# Patient Record
Sex: Male | Born: 1960 | Race: Black or African American | Marital: Single | Smoking: Current every day smoker
Health system: Southern US, Community
[De-identification: ages and names within clinical notes are randomized; demographics above are authoritative.]

---

## 2011-04-01 ENCOUNTER — Emergency Department (HOSPITAL_COMMUNITY)
Admission: EM | Admit: 2011-04-01 | Discharge: 2011-04-01 | Disposition: A | Discharge status: Home / Self Care | Payer: Self-pay | Attending: Emergency Medicine | Admitting: Emergency Medicine

## 2011-04-01 ENCOUNTER — Emergency Department (HOSPITAL_COMMUNITY): Payer: Self-pay

## 2011-04-01 DIAGNOSIS — W540XXA Bitten by dog, initial encounter: Secondary | ICD-10-CM | POA: Insufficient documentation

## 2011-04-01 DIAGNOSIS — F17200 Nicotine dependence, unspecified, uncomplicated: Secondary | ICD-10-CM | POA: Insufficient documentation

## 2011-04-01 DIAGNOSIS — S61409A Unspecified open wound of unspecified hand, initial encounter: Secondary | ICD-10-CM | POA: Insufficient documentation

## 2011-04-01 DIAGNOSIS — S61459A Open bite of unspecified hand, initial encounter: Secondary | ICD-10-CM

## 2011-04-01 MED ORDER — HYDROCODONE-ACETAMINOPHEN 5-325 MG PO TABS
2.0000 | ORAL_TABLET | Freq: Once | ORAL | Status: AC
  Administered 2011-04-01: 2 via ORAL
  Filled 2011-04-01: qty 2

## 2011-04-01 MED ORDER — AMOXICILLIN-POT CLAVULANATE 875-125 MG PO TABS
1.0000 | ORAL_TABLET | Freq: Once | ORAL | Status: AC
  Administered 2011-04-01: 1 via ORAL
  Filled 2011-04-01: qty 1

## 2011-04-01 MED ORDER — TETANUS-DIPHTH-ACELL PERTUSSIS 5-2.5-18.5 LF-MCG/0.5 IM SUSP
0.5000 mL | Freq: Once | INTRAMUSCULAR | Status: AC
  Administered 2011-04-01: 0.5 mL via INTRAMUSCULAR
  Filled 2011-04-01: qty 0.5

## 2011-04-01 MED ORDER — ONDANSETRON HCL 4 MG PO TABS
4.0000 mg | ORAL_TABLET | Freq: Once | ORAL | Status: AC
  Administered 2011-04-01: 4 mg via ORAL
  Filled 2011-04-01: qty 1

## 2011-04-01 MED ORDER — HYDROCODONE-ACETAMINOPHEN 5-325 MG PO TABS: ORAL_TABLET | ORAL | Status: DC

## 2011-04-01 MED ORDER — AMOXICILLIN-POT CLAVULANATE 875-125 MG PO TABS: ORAL_TABLET | ORAL | Status: DC

## 2011-04-01 NOTE — ED Notes (Signed)
Pressure dressing and bulk dressing applied.

## 2011-04-01 NOTE — ED Notes (Signed)
Pt spilled soak on floor. New mixture of NS and betadine provided and spill cleaned up.

## 2011-04-01 NOTE — ED Notes (Signed)
Pt a/ox4. resp even and unlabored. D/C instructions and Rx x2 reviewed with pt. Pt verbalized understanding. Pt ambulated to POV with steady gate. Family with pt to transport home.

## 2011-04-01 NOTE — ED Notes (Signed)
Report made to [facility].

## 2011-04-01 NOTE — ED Notes (Signed)
Pt reports was bit by a friend's dog on the R hand.  Pt says unsure if shots are up to date on the dog.  Pt says thinks his tetanus shot was greater than 10years ago.  Pt has puncture wound on top of R hand and on bottom of hand.  Bleeding controlled, swelling noted.

## 2011-04-02 NOTE — ED Provider Notes (Signed)
Medical screening examination/treatment/procedure(s) were performed by non-physician practitioner and as supervising physician I was immediately available for consultation/collaboration.   Shelda Jakes, MD 04/02/11 [phone]

## 2011-04-02 NOTE — ED Provider Notes (Signed)
History     CSN: [id number] Arrival date & time: 04/01/2011  1:41 PM   First MD Initiated Contact with Patient 04/01/11 1343      Chief Complaint  Patient presents with  . Animal Bite    (Consider location/radiation/quality/duration/timing/severity/associated sxs/prior treatment) Patient is a 50 y.o. male presenting with animal bite. The history is provided by the patient.  Animal Bite  The incident occurred just prior to arrival. The incident occurred at another residence. He came to the ER via personal transport. There is an injury to the right hand. The pain is moderate. It is unlikely that a foreign body is present. Pertinent negatives include no chest pain, no numbness, no abdominal pain, no bladder incontinence, no neck pain, no seizures, no cough and no difficulty breathing. There have been no prior injuries to these areas. He is right-handed. His tetanus status is out of date. He has been behaving normally. He has received no recent medical care.    History reviewed. No pertinent past medical history.  History reviewed. No pertinent past surgical history.  No family history on file.  History  Substance Use Topics  . Smoking status: Current Everyday Smoker  . Smokeless tobacco: Not on file  . Alcohol Use: Yes     heavily      Review of Systems  Constitutional: Negative for activity change.       All ROS Neg except as noted in HPI  HENT: Negative for nosebleeds and neck pain.   Eyes: Negative for photophobia and discharge.  Respiratory: Negative for cough, shortness of breath and wheezing.   Cardiovascular: Negative for chest pain and palpitations.  Gastrointestinal: Negative for abdominal pain and blood in stool.  Genitourinary: Negative for bladder incontinence, dysuria, frequency and hematuria.  Musculoskeletal: Negative for back pain and arthralgias.  Skin: Negative.   Neurological: Negative for dizziness, seizures, speech difficulty and numbness.    Psychiatric/Behavioral: Negative for hallucinations and confusion.    Allergies  Review of patient's allergies indicates no known allergies.  Home Medications   Current Outpatient Rx  Name Route Sig Dispense Refill  . AMOXICILLIN-POT CLAVULANATE 875-125 MG PO TABS  1 po bid with food 14 tablet 0  . HYDROCODONE-ACETAMINOPHEN 5-325 MG PO TABS  1 po q4h prn pain 20 tablet 0    BP 114/86  Pulse 84  Temp(Src) 97.6 F (36.4 C) (Oral)  Resp 17  Ht 6' (1.829 m)  Wt 180 lb (81.647 kg)  BMI 24.41 kg/m2  SpO2 100%  Physical Exam  Nursing note and vitals reviewed. Constitutional: He is oriented to person, place, and time. He appears well-developed and well-nourished.  Non-toxic appearance.  HENT:  Head: Normocephalic.  Right Ear: Tympanic membrane and external ear normal.  Left Ear: Tympanic membrane and external ear normal.  Eyes: EOM and lids are normal. Pupils are equal, round, and reactive to light.  Neck: Normal range of motion. Neck supple. Carotid bruit is not present.  Cardiovascular: Normal rate, regular rhythm, normal heart sounds, intact distal pulses and normal pulses.   Pulmonary/Chest: Breath sounds normal. No respiratory distress.  Abdominal: Soft. Bowel sounds are normal. There is no tenderness. There is no guarding.  Musculoskeletal: Normal range of motion.       Puncture wound to the mid dorsum of the right hand, with oozing of blood and hematoma present. No arterial spray. Good cap refill of the right hand. Palmar arch test wnl. FROM of all fingers. The is a shallow laceration at the  ulnar base of the right hand/wrist area. No bleeding from this site.   Lymphadenopathy:       Head (right side): No submandibular adenopathy present.       Head (left side): No submandibular adenopathy present.    He has no cervical adenopathy.  Neurological: He is alert and oriented to person, place, and time. He has normal strength. No cranial nerve deficit or sensory deficit. He  exhibits normal muscle tone.  Skin: Skin is warm and dry.  Psychiatric: He has a normal mood and affect. His speech is normal.    ED Course: Sheriff came to talk with pt. They will observe the dog. Tetanus and antibiotic given to the patient while in ED. Sterile pressure bandage applied to the patient's right hand.  Procedures (including critical care time)  Labs Reviewed - No data to display Dg Hand Complete Right  04/01/2011  *RADIOLOGY REPORT*  Clinical Data: Dog bite, lacerations to the right hand  RIGHT HAND - COMPLETE 3+ VIEW  Comparison: None.  Findings: No radiopaque foreign body.  No fracture or dislocation. No soft tissue abnormality.  The fingers are incompletely splayed on the lateral view.  IMPRESSION: No acute finding.  Original Report Authenticated By: Harrel Lemon, M.D.     1. Animal bite of hand       MDM  I have reviewed nursing notes, vital signs, and all appropriate lab and imaging results for this patient.        Kathie Dike, [place] 04/02/11 1119

## 2013-05-10 ENCOUNTER — Encounter (HOSPITAL_COMMUNITY): Payer: Self-pay | Admitting: Emergency Medicine

## 2013-05-10 ENCOUNTER — Emergency Department (HOSPITAL_COMMUNITY): Payer: Self-pay

## 2013-05-10 ENCOUNTER — Inpatient Hospital Stay (HOSPITAL_COMMUNITY)
Admission: EM | Admit: 2013-05-10 | Discharge: 2013-05-13 | DRG: 603 | Disposition: A | Discharge status: Home / Self Care | Payer: Self-pay | Attending: Otolaryngology | Admitting: Otolaryngology

## 2013-05-10 DIAGNOSIS — F17200 Nicotine dependence, unspecified, uncomplicated: Secondary | ICD-10-CM | POA: Diagnosis present

## 2013-05-10 DIAGNOSIS — L0211 Cutaneous abscess of neck: Principal | ICD-10-CM | POA: Diagnosis present

## 2013-05-10 LAB — BASIC METABOLIC PANEL
BUN: 7 mg/dL (ref 6–23)
Creatinine, Ser: 0.78 mg/dL (ref 0.50–1.35)
GFR calc Af Amer: >90 mL/min (ref >90)
GFR calc non Af Amer: >90 mL/min (ref >90)
Glucose, Bld: 127 mg/dL — ABNORMAL HIGH (ref 70–99)

## 2013-05-10 LAB — CBC WITH DIFFERENTIAL/PLATELET
Basophils Relative: 0 % (ref 0–1)
Eosinophils Absolute: 0 10*3/uL (ref 0.0–0.7)
Eosinophils Relative: 0 % (ref 0–5)
HCT: 41.1 % (ref 39.0–52.0)
Hemoglobin: 13.8 g/dL (ref 13.0–17.0)
MCH: 30.4 pg (ref 26.0–34.0)
MCHC: 33.6 g/dL (ref 30.0–36.0)
MCV: 90.5 fL (ref 78.0–100.0)
Monocytes Absolute: 1.5 10*3/uL — ABNORMAL HIGH (ref 0.1–1.0)
Monocytes Relative: 14 % — ABNORMAL HIGH (ref 3–12)

## 2013-05-10 MED ORDER — ACETAMINOPHEN 160 MG/5ML PO SOLN
ORAL | Status: AC
  Administered 2013-05-10: 650 mg
  Filled 2013-05-10: qty 20.3

## 2013-05-10 MED ORDER — VANCOMYCIN HCL IN DEXTROSE 1-5 GM/200ML-% IV SOLN
1000.0000 mg | Freq: Once | INTRAVENOUS | Status: AC
  Administered 2013-05-10: 1000 mg via INTRAVENOUS
  Filled 2013-05-10: qty 200

## 2013-05-10 MED ORDER — IOHEXOL 300 MG/ML  SOLN
100.0000 mL | Freq: Once | INTRAMUSCULAR | Status: AC | PRN
  Administered 2013-05-10: 100 mL via INTRAVENOUS

## 2013-05-10 MED ORDER — ACETAMINOPHEN 325 MG PO TABS
650.0000 mg | ORAL_TABLET | Freq: Once | ORAL | Status: DC
  Filled 2013-05-10: qty 2

## 2013-05-10 MED ORDER — SODIUM CHLORIDE 0.9 % IV SOLN
Freq: Once | INTRAVENOUS | Status: AC
  Administered 2013-05-10: 21:00:00 via INTRAVENOUS

## 2013-05-10 NOTE — ED Provider Notes (Signed)
CSN: [id number]     Arrival date & time 05/10/13  1737 History   First MD Initiated Contact with Patient 05/10/13 2038     Chief Complaint  Patient presents with  . Abscess   (Consider location/radiation/quality/duration/timing/severity/associated sxs/prior Treatment) HPI Hx per PT - L neck pain. Abscess, worsening over the last 2 days - evaluated at [facility] and sent here to see ENT Dr Pollyann Kennedy.  IV ABx were provided. PT still has some pain.  Sharp pain mod to severe with severe swelling and today fever. No trouble breathing History reviewed. No pertinent past medical history. History reviewed. No pertinent past surgical history. No family history on file. History  Substance Use Topics  . Smoking status: Current Every Day Smoker  . Smokeless tobacco: Not on file  . Alcohol Use: Yes     Comment: heavily    Review of Systems  Constitutional: Positive for fever.  HENT: Negative for sore throat and voice change.   Respiratory: Negative for shortness of breath.   Cardiovascular: Negative for chest pain.  Gastrointestinal: Negative for vomiting and abdominal pain.  Genitourinary: Negative for dysuria.  Musculoskeletal: Positive for neck pain.  Skin: Positive for rash and wound.  Neurological: Negative for headaches.  All other systems reviewed and are negative.    Allergies  Review of patient's allergies indicates no known allergies.  Home Medications   Current Outpatient Rx  Name  Route  Sig  Dispense  Refill  . acetaminophen (TYLENOL) 500 MG tablet   Oral   Take 500 mg by mouth every 6 (six) hours as needed.         Marland Kitchen Phenyleph-CPM-DM-Aspirin (ALKA-SELTZER PLUS COLD & COUGH) 7.12-14-08-325 MG TBEF   Oral   Take 1 tablet by mouth daily as needed (for cold relief).          BP 131/89  Pulse 93  Temp(Src) 100.6 F (38.1 C) (Oral)  Resp 20  Ht 6' (1.829 m)  Wt 180 lb (81.647 kg)  BMI 24.41 kg/m2  SpO2 99% Physical Exam  Constitutional: He is oriented to person,  place, and time. He appears well-developed and well-nourished.  HENT:  Head: Normocephalic and atraumatic.  Eyes: EOM are normal. Pupils are equal, round, and reactive to light.  Neck:  Large area of tenderness and fluctuance to left lateral neck with erythema and inc warmth to touch   Cardiovascular: Regular rhythm and intact distal pulses.   Pulmonary/Chest: Effort normal. No respiratory distress.  Abdominal: Soft. He exhibits no distension. There is no tenderness. There is no rebound.  Musculoskeletal: Normal range of motion. He exhibits no edema.  Neurological: He is alert and oriented to person, place, and time.  Skin: Skin is warm and dry.    ED Course  Procedures (including critical care time) Labs Review Labs Reviewed  CBC WITH DIFFERENTIAL - Abnormal; Notable for the following:    Monocytes Relative 14 (*)    Monocytes Absolute 1.5 (*)    All other components within normal limits  BASIC METABOLIC PANEL - Abnormal; Notable for the following:    Sodium 129 (*)    Chloride 90 (*)    Glucose, Bld 127 (*)    All other components within normal limits   Imaging Review Ct Soft Tissue Neck W Contrast  05/10/2013   CLINICAL DATA:  Tender enlarged lymph node on the left neck for 4 days. Fever.  EXAM: CT NECK WITH CONTRAST  TECHNIQUE: Multidetector CT imaging of the neck was performed  using the standard protocol following the bolus administration of intravenous contrast.  CONTRAST:  OMNIPAQUE IOHEXOL 300 MG/ML  SOLN  COMPARISON:  None.  FINDINGS: The large heterogeneous mass is centered in the left submandibular space. Is contiguous with the lateral margin of the left submandibular gland, which is deviated medially. This appears to have arisen from the left submandibular gland. It is predominantly of low attenuation with irregular peripheral enhancement and areas of central reticular enhancement. The adjacent soft tissues are inflamed. The mass extends superiorly to abut the left  [facility] term muscle and the inferior aspect of the superficial left parotid gland. There is only mild associated adenopathy. A left Parry jugular level 2 node is 11 mm in short axis.  The right submandibular gland is unremarkable. The soft tissue edema extends across the submental space to the right submandibular space. Mass effect mildly deviates the or all pharynx and laryngeal structures to the right. The parapharyngeal spaces are relatively maintained. The nasopharynx is unremarkable. The mass measures 6.3 cm x 5.7 cm x 7.3 cm. It abuts the inferior aspect of the mandible mostly along its angle. No bone resorption is seen to suggest osteomyelitis.  The structures of the skullbase are unremarkable. Normal globes and orbits. The airway is widely patent. Edema extends to involve the submucosal space of the oral pharynx. No mucosal space mass is seen. The thyroid gland is unremarkable.  The lung apices are clear. The aortic arch is mildly dilated to a maximum of 3.6 cm.  There are mild degenerative changes along the mid cervical spine. No osteoblastic or osteolytic lesions.  IMPRESSION: 1. There is a large mass centered on the left submandibular space. This may have arisen from the lateral margin of the left submandibular gland. Given the history, a submandibular gland abscess is suspected. However, based on imaging alone, this could be a large necrotic mass, which would include neoplastic disease. 2. There is only mild associated left neck adenopathy. 3. There is surrounding soft tissue edema with edema extending to the sub mucosal space of the oral pharynx. Mass effect causes mild deviation of the oral pharynx and larynx to the right. No airway compromise.   Electronically Signed   By: Amie Portland M.D.   On: 05/10/2013 21:34   11:53 PM Dr Pollyann Kennedy bedside, plan OR for I/D.  MDM   1. Neck abscess    CT as above IV ABx  Pain control    Sunnie Nielsen, MD 05/10/13 [phone]

## 2013-05-10 NOTE — ED Provider Notes (Signed)
CSN: [id number]     Arrival date & time 05/10/13  1737 History  This chart was scribed for Benny Lennert, MD by Clydene Laming, ED Scribe. This patient was seen in room [facility] and the patient's care was started at 8:40 PM.   Chief Complaint  Patient presents with  . Abscess    Patient is a 52 y.o. male presenting with abscess. The history is provided by the patient. No language interpreter was used.  Abscess Location:  Head/neck and face Head/neck abscess location:  L neck Facial abscess location:  Face Size:  30 cm (circumference) Abscess quality: painful and redness   Red streaking: yes   Duration:  4 days Progression:  Worsening Pain details:    Quality:  Dull   Severity:  Mild   Duration:  4 days   Timing:  Constant   Progression:  Worsening Chronicity:  New Associated symptoms: fever   Associated symptoms: no fatigue and no headaches    HPI Comments: Richard Mcfarland is a 52 y.o. male who presents to the Emergency Department complaining of a large swollen area to left side of face and neck with redness onset 4 days ago with an associated fever. Pt states swelling became worse the past 2 days. He denies any medical problems, trouble breathing, or swallowing.   History reviewed. No pertinent past medical history. History reviewed. No pertinent past surgical history. No family history on file. History  Substance Use Topics  . Smoking status: Current Every Day Smoker  . Smokeless tobacco: Not on file  . Alcohol Use: Yes     Comment: heavily    Review of Systems  Constitutional: Positive for fever. Negative for appetite change and fatigue.  HENT: Positive for facial swelling. Negative for congestion, ear discharge and sinus pressure.   Eyes: Negative for discharge.  Respiratory: Negative for cough.   Cardiovascular: Negative for chest pain.  Gastrointestinal: Negative for abdominal pain and diarrhea.  Genitourinary: Negative for frequency and hematuria.   Musculoskeletal: Negative for back pain.  Skin: Negative for rash.  Neurological: Negative for seizures and headaches.  Psychiatric/Behavioral: Negative for hallucinations.    Allergies  Review of patient's allergies indicates no known allergies.  Home Medications   Current Outpatient Rx  Name  Route  Sig  Dispense  Refill  . amoxicillin-clavulanate (AUGMENTIN) 875-125 MG per tablet      1 po bid with food   14 tablet   0   . HYDROcodone-acetaminophen (NORCO) 5-325 MG per tablet      1 po q4h prn pain   20 tablet   0    BP 139/97  Pulse 109  Temp(Src) 102.8 F (39.3 C) (Oral)  Resp 20  Ht 6' (1.829 m)  Wt 180 lb (81.647 kg)  BMI 24.41 kg/m2  SpO2 100% Physical Exam  Nursing note and vitals reviewed. Constitutional: He is oriented to person, place, and time. He appears well-developed and well-nourished.  HENT:  Head: Normocephalic and atraumatic.  Large abscess of the left side of the upper neck and face Pt has trismus (can only open mouth 1.5 cm )  Abscess 8 cm diameter  Eyes: EOM are normal.  Neck: Normal range of motion.  Cardiovascular: Normal rate, regular rhythm, normal heart sounds and intact distal pulses.   Pulmonary/Chest: Effort normal and breath sounds normal. No respiratory distress.  Abdominal: Soft. He exhibits no distension. There is no tenderness.  Musculoskeletal: Normal range of motion.  Neurological: He is alert and oriented  to person, place, and time.  Skin: Skin is warm and dry.  Psychiatric: He has a normal mood and affect. Judgment normal.    ED Course  Procedures (including critical care time) DIAGNOSTIC STUDIES: Oxygen Saturation is 100% on RA, normal by my interpretation.    COORDINATION OF CARE: 8:46 PM- Discussed treatment plan with pt at bedside. Pt verbalized understanding and agreement with plan.   Labs Review Labs Reviewed - No data to display Imaging Review No results found.  EKG Interpretation   None      Dr.  Pollyann Kennedy called and will see the pt in the er at [facility]  No diagnosis found. I personally performed the services described in this documentation, which was scribed in my presence. The recorded information has been reviewed and is accurate.     Benny Lennert, MD 05/10/13 2138

## 2013-05-10 NOTE — ED Notes (Signed)
Pt reports tender enlarged lympnode on left side of neck x 4 days.  Reports swelling became worse the past 2 days.  Pt has large swollen area to left side of face and neck, redness, noted and pt has fever.

## 2013-05-10 NOTE — H&P (Signed)
Richard Mcfarland is an 52 y.o. male.   Chief Complaint: Neck swelling HPI: 4 day history of increasing left neck swelling and pain. No prior history.  History reviewed. No pertinent past medical history.  History reviewed. No pertinent past surgical history.  No family history on file. Social History:  reports that he has been smoking.  He does not have any smokeless tobacco history on file. He reports that he drinks alcohol. He reports that he does not use illicit drugs. He hasn't had a drink in 3 days.  Allergies: No Known Allergies   (Not in a hospital admission)  Results for orders placed during the hospital encounter of 05/10/13 (from the past 48 hour(s))  CBC WITH DIFFERENTIAL     Status: Abnormal   Collection Time    05/10/13  8:48 PM      Result Value Range   WBC 10.2  4.0 - 10.5 K/uL   RBC 4.54  4.22 - 5.81 MIL/uL   Hemoglobin 13.8  13.0 - 17.0 g/dL   HCT 16.1  09.6 - 04.5 %   MCV 90.5  78.0 - 100.0 fL   MCH 30.4  26.0 - 34.0 pg   MCHC 33.6  30.0 - 36.0 g/dL   RDW 40.9  81.1 - 91.4 %   Platelets 286  150 - 400 K/uL   Neutrophils Relative % 72  43 - 77 %   Neutro Abs 7.4  1.7 - 7.7 K/uL   Lymphocytes Relative 13  12 - 46 %   Lymphs Abs 1.3  0.7 - 4.0 K/uL   Monocytes Relative 14 (*) 3 - 12 %   Monocytes Absolute 1.5 (*) 0.1 - 1.0 K/uL   Eosinophils Relative 0  0 - 5 %   Eosinophils Absolute 0.0  0.0 - 0.7 K/uL   Basophils Relative 0  0 - 1 %   Basophils Absolute 0.0  0.0 - 0.1 K/uL  BASIC METABOLIC PANEL     Status: Abnormal   Collection Time    05/10/13  8:48 PM      Result Value Range   Sodium 129 (*) 135 - 145 mEq/L   Potassium 3.5  3.5 - 5.1 mEq/L   Chloride 90 (*) 96 - 112 mEq/L   CO2 28  19 - 32 mEq/L   Glucose, Bld 127 (*) 70 - 99 mg/dL   BUN 7  6 - 23 mg/dL   Creatinine, Ser 7.82  0.50 - 1.35 mg/dL   Calcium 9.3  8.4 - 95.6 mg/dL   GFR calc non Af Amer >90  >90 mL/min   GFR calc Af Amer >90  >90 mL/min   Comment: (NOTE)     The eGFR has been  calculated using the CKD EPI equation.     This calculation has not been validated in all clinical situations.     eGFR's persistently <90 mL/min signify possible Chronic Kidney     Disease.   Ct Soft Tissue Neck W Contrast  05/10/2013   CLINICAL DATA:  Tender enlarged lymph node on the left neck for 4 days. Fever.  EXAM: CT NECK WITH CONTRAST  TECHNIQUE: Multidetector CT imaging of the neck was performed using the standard protocol following the bolus administration of intravenous contrast.  CONTRAST:  OMNIPAQUE IOHEXOL 300 MG/ML  SOLN  COMPARISON:  None.  FINDINGS: The large heterogeneous mass is centered in the left submandibular space. Is contiguous with the lateral margin of the left submandibular gland, which is deviated  medially. This appears to have arisen from the left submandibular gland. It is predominantly of low attenuation with irregular peripheral enhancement and areas of central reticular enhancement. The adjacent soft tissues are inflamed. The mass extends superiorly to abut the left [facility] term muscle and the inferior aspect of the superficial left parotid gland. There is only mild associated adenopathy. A left Parry jugular level 2 node is 11 mm in short axis.  The right submandibular gland is unremarkable. The soft tissue edema extends across the submental space to the right submandibular space. Mass effect mildly deviates the or all pharynx and laryngeal structures to the right. The parapharyngeal spaces are relatively maintained. The nasopharynx is unremarkable. The mass measures 6.3 cm x 5.7 cm x 7.3 cm. It abuts the inferior aspect of the mandible mostly along its angle. No bone resorption is seen to suggest osteomyelitis.  The structures of the skullbase are unremarkable. Normal globes and orbits. The airway is widely patent. Edema extends to involve the submucosal space of the oral pharynx. No mucosal space mass is seen. The thyroid gland is unremarkable.  The lung apices are  clear. The aortic arch is mildly dilated to a maximum of 3.6 cm.  There are mild degenerative changes along the mid cervical spine. No osteoblastic or osteolytic lesions.  IMPRESSION: 1. There is a large mass centered on the left submandibular space. This may have arisen from the lateral margin of the left submandibular gland. Given the history, a submandibular gland abscess is suspected. However, based on imaging alone, this could be a large necrotic mass, which would include neoplastic disease. 2. There is only mild associated left neck adenopathy. 3. There is surrounding soft tissue edema with edema extending to the sub mucosal space of the oral pharynx. Mass effect causes mild deviation of the oral pharynx and larynx to the right. No airway compromise.   Electronically Signed   By: Amie Portland M.D.   On: 05/10/2013 21:34    ROS: otherwise negative  Blood pressure 131/89, pulse 93, temperature 100.6 F (38.1 C), temperature source Oral, resp. rate 20, height 6' (1.829 m), weight 180 lb (81.647 kg), SpO2 99.00%.  PHYSICAL EXAM: Overall appearance:  Healthy appearing, in no distress Head:  Normocephalic, atraumatic. Ears: External ears look normal. Nose: External nose is healthy in appearance. Internal nasal exam free of any lesions or obstruction. Oral Cavity/pharynx:  There are no mucosal lesions or masses identified. There is mild trismus. The floor of mouth and pharynx/base of tongue are free of any swelling. Dentition is in poor repair. Neuro:  No identifiable neurologic deficits. Neck: Very large fluctuant swelling of the left submandibular area. It is mildly tender to touch.  Studies Reviewed: CT of neck with contrast reviewed.    Assessment/Plan Neck abscess, submandibular region, possibly dental origin. Recommend incision and drainage under general anesthesia. Airway is not expected to be an issue. We discussed the very unlikely need for tracheostomy. We will need intravenous  antibiotics and wound care for at least several days in the hospital. He will ultimately need dental care. He has a history of alcohol use, has not had a drink in 3 days. He has had the "shakes" in the past. All questions were answered.  Haruna Rohlfs 05/10/2013, 11:56 PM

## 2013-05-11 ENCOUNTER — Emergency Department (HOSPITAL_COMMUNITY): Payer: Self-pay | Admitting: Anesthesiology

## 2013-05-11 ENCOUNTER — Encounter (HOSPITAL_COMMUNITY)
Admission: EM | Disposition: A | Discharge status: Home / Self Care | Payer: Self-pay | Source: Home / Self Care | Attending: Otolaryngology

## 2013-05-11 ENCOUNTER — Encounter (HOSPITAL_COMMUNITY): Payer: Self-pay | Admitting: Anesthesiology

## 2013-05-11 DIAGNOSIS — L0211 Cutaneous abscess of neck: Secondary | ICD-10-CM | POA: Diagnosis present

## 2013-05-11 HISTORY — PX: INCISION AND DRAINAGE ABSCESS: SHX5864

## 2013-05-11 SURGERY — INCISION AND DRAINAGE, ABSCESS
Anesthesia: General | Site: Neck | Laterality: Left

## 2013-05-11 MED ORDER — DEXTROSE-NACL 5-0.9 % IV SOLN
INTRAVENOUS | Status: DC
  Administered 2013-05-11 (×2): via INTRAVENOUS

## 2013-05-11 MED ORDER — ONDANSETRON HCL 4 MG/2ML IJ SOLN
INTRAMUSCULAR | Status: DC | PRN
  Administered 2013-05-11: 4 mg via INTRAVENOUS

## 2013-05-11 MED ORDER — LIDOCAINE HCL (CARDIAC) 20 MG/ML IV SOLN
INTRAVENOUS | Status: DC | PRN
  Administered 2013-05-11: 75 mg via INTRAVENOUS

## 2013-05-11 MED ORDER — FENTANYL CITRATE 0.05 MG/ML IJ SOLN
INTRAMUSCULAR | Status: DC | PRN
  Administered 2013-05-11: 100 ug via INTRAVENOUS

## 2013-05-11 MED ORDER — HYDROMORPHONE HCL PF 1 MG/ML IJ SOLN: 0.2500 mg | INTRAMUSCULAR | Status: DC | PRN

## 2013-05-11 MED ORDER — IBUPROFEN 100 MG/5ML PO SUSP
400.0000 mg | Freq: Four times a day (QID) | ORAL | Status: DC | PRN
  Filled 2013-05-11: qty 20

## 2013-05-11 MED ORDER — SODIUM CHLORIDE 0.9 % IV SOLN: 3.0000 g | Freq: Four times a day (QID) | INTRAVENOUS | Status: DC

## 2013-05-11 MED ORDER — PROMETHAZINE HCL 25 MG PO TABS: 25.0000 mg | ORAL_TABLET | Freq: Four times a day (QID) | ORAL | Status: DC | PRN

## 2013-05-11 MED ORDER — PROPOFOL 10 MG/ML IV BOLUS
INTRAVENOUS | Status: DC | PRN
  Administered 2013-05-11: 200 mg via INTRAVENOUS

## 2013-05-11 MED ORDER — MIDAZOLAM HCL 5 MG/5ML IJ SOLN
INTRAMUSCULAR | Status: DC | PRN
  Administered 2013-05-11: 2 mg via INTRAVENOUS

## 2013-05-11 MED ORDER — SODIUM CHLORIDE 0.9 % IV SOLN
3.0000 g | Freq: Four times a day (QID) | INTRAVENOUS | Status: DC
  Administered 2013-05-11 – 2013-05-13 (×11): 3 g via INTRAVENOUS
  Filled 2013-05-11 (×15): qty 3

## 2013-05-11 MED ORDER — LACTATED RINGERS IV SOLN
INTRAVENOUS | Status: DC | PRN
  Administered 2013-05-11: via INTRAVENOUS

## 2013-05-11 MED ORDER — INFLUENZA VAC SPLIT QUAD 0.5 ML IM SUSP
0.5000 mL | INTRAMUSCULAR | Status: DC
  Filled 2013-05-11: qty 0.5

## 2013-05-11 MED ORDER — SUCCINYLCHOLINE CHLORIDE 20 MG/ML IJ SOLN
INTRAMUSCULAR | Status: DC | PRN
  Administered 2013-05-11: 120 mg via INTRAVENOUS

## 2013-05-11 MED ORDER — HYDROCODONE-ACETAMINOPHEN 5-325 MG PO TABS: 1.0000 | ORAL_TABLET | ORAL | Status: DC | PRN

## 2013-05-11 MED ORDER — 0.9 % SODIUM CHLORIDE (POUR BTL) OPTIME
TOPICAL | Status: DC | PRN
  Administered 2013-05-11: 1000 mL

## 2013-05-11 MED ORDER — PROMETHAZINE HCL 25 MG RE SUPP: 25.0000 mg | Freq: Four times a day (QID) | RECTAL | Status: DC | PRN

## 2013-05-11 SURGICAL SUPPLY — 25 items
BLADE SURG 15 STRL LF DISP TIS (BLADE) ×1 IMPLANT
BLADE SURG 15 STRL SS (BLADE) ×1
CANISTER SUCTION 2500CC (MISCELLANEOUS) IMPLANT
COVER SURGICAL LIGHT HANDLE (MISCELLANEOUS) ×2 IMPLANT
DRAIN PENROSE 1/2X12 LTX STRL (WOUND CARE) ×2 IMPLANT
DRAIN PENROSE 1/4X12 LTX STRL (WOUND CARE) IMPLANT
ELECT COATED BLADE 2.86 ST (ELECTRODE) ×2 IMPLANT
ELECT REM PT RETURN 9FT ADLT (ELECTROSURGICAL) ×2
ELECTRODE REM PT RTRN 9FT ADLT (ELECTROSURGICAL) ×1 IMPLANT
GLOVE ECLIPSE 7.5 STRL STRAW (GLOVE) ×2 IMPLANT
GOWN STRL NON-REIN LRG LVL3 (GOWN DISPOSABLE) ×4 IMPLANT
KIT BASIN OR (CUSTOM PROCEDURE TRAY) ×2 IMPLANT
KIT ROOM TURNOVER OR (KITS) ×2 IMPLANT
NS IRRIG 1000ML POUR BTL (IV SOLUTION) ×2 IMPLANT
PAD ARMBOARD 7.5X6 YLW CONV (MISCELLANEOUS) ×4 IMPLANT
PIN SAFETY STERILE (MISCELLANEOUS) ×2 IMPLANT
SPONGE GAUZE 4X4 12PLY (GAUZE/BANDAGES/DRESSINGS) ×2 IMPLANT
SUT ETHILON 3 0 FSL (SUTURE) ×2 IMPLANT
SUT ETHILON 4 0 PS 2 18 (SUTURE) ×2 IMPLANT
SWAB COLLECTION DEVICE MRSA (MISCELLANEOUS) IMPLANT
TOWEL OR 17X24 6PK STRL BLUE (TOWEL DISPOSABLE) ×2 IMPLANT
TOWEL OR 17X26 10 PK STRL BLUE (TOWEL DISPOSABLE) ×2 IMPLANT
TRAY ENT MC OR (CUSTOM PROCEDURE TRAY) ×2 IMPLANT
TUBE ANAEROBIC SPECIMEN COL (MISCELLANEOUS) IMPLANT
TUBE GAUZE SZ 8 (GAUZE/BANDAGES/DRESSINGS) ×2 IMPLANT

## 2013-05-11 NOTE — Transfer of Care (Signed)
Immediate Anesthesia Transfer of Care Note  Patient: Richard Mcfarland  Procedure(s) Performed: Procedure(s): INCISION AND DRAINAGE ABSCESS (Left)  Patient Location: PACU  Anesthesia Type:General  Level of Consciousness: awake, alert  and oriented  Airway & Oxygen Therapy: Patient Spontanous Breathing and Patient connected to nasal cannula oxygen  Post-op Assessment: Report given to PACU RN and Post -op Vital signs reviewed and stable  Post vital signs: Reviewed and stable  Complications: No apparent anesthesia complications

## 2013-05-11 NOTE — Op Note (Signed)
OPERATIVE REPORT  DATE OF SURGERY: 05/11/2013  PATIENT:  Richard Mcfarland,  52 y.o. male  PRE-OPERATIVE DIAGNOSIS:  left neck abscess  POST-OPERATIVE DIAGNOSIS:  left neck abscess  PROCEDURE:  Procedure(s): INCISION AND DRAINAGE ABSCESS  SURGEON:  Susy Frizzle, MD  ASSISTANTS: none  ANESTHESIA:   General   EBL:  25 ml  DRAINS: 2 half-inch Penrose drains  LOCAL MEDICATIONS USED:  None  SPECIMEN:  Specimen was sent for culture and sensitivity testing.  COUNTS:  Correct  PROCEDURE DETAILS: The patient was taken to the operating room and placed on the operating table in the supine position. Following induction of general endotracheal anesthesia, the left neck was prepped and draped in a standard fashion. Electrocautery was used to incise the skin in a transverse fashion approximately 2 fingerbreadths below the angle of the mandible. This was done in the center of the abscess cavity. Blunt dissection was then used to open into the abscess and a large amount of purulent mucoid secretions were obtained. Samples were sent for culture and sensitivity testing. A Yankauer suction was used to evacuate all of the infectious material. Saline was then irrigated into the wound and suctioned again. 2 half-inch Penrose drains were placed one anterosuperiorly and the other superiorly. These were secured in place with a nylon suture and a sterile safety pin. Dressing was applied. Plastic gauze was placed. Patient was awakened extubated and transferred to recovery in stable condition.    PATIENT DISPOSITION:  To PACU, stable

## 2013-05-11 NOTE — Progress Notes (Signed)
Patient transferred to 6N01 from PACU following drainage of an abcess on the left side of his neck.  Patient oriented to unit and equipment with call bell in reach.  He denies pain at this time.  Vital signs stable and no acute distress.  Will continue to monitor.

## 2013-05-11 NOTE — Anesthesia Procedure Notes (Signed)
Procedure Name: Intubation Date/Time: 05/11/2013 12:30 AM Performed by: Mera Gunkel S Pre-anesthesia Checklist: Patient identified, Timeout performed, Emergency Drugs available, Suction available and Patient being monitored Patient Re-evaluated:Patient Re-evaluated prior to inductionOxygen Delivery Method: Circle system utilized Preoxygenation: Pre-oxygenation with 100% oxygen Intubation Type: IV induction, Rapid sequence and Cricoid Pressure applied Ventilation: Mask ventilation without difficulty Laryngoscope size: planned glidescope. Tube type: Oral Tube size: 8.0 mm Number of attempts: 1 Airway Equipment and Method: Stylet Placement Confirmation: ETT inserted through vocal cords under direct vision,  positive ETCO2 and breath sounds checked- equal and bilateral Secured at: 23 cm Tube secured with: Tape

## 2013-05-11 NOTE — Anesthesia Preprocedure Evaluation (Addendum)
Anesthesia Evaluation  Patient identified by MRN, date of birth, ID band Patient awake  General Assessment Comment:Case discussed with Dr. Pollyann Kennedy. Airway patent and glide video scope will be utilized. CE  Reviewed: Allergy & Precautions, H&P , NPO status , Patient's Chart, lab work & pertinent test results  History of Anesthesia Complications Negative for: history of anesthetic complications  Airway Mallampati: II    Mouth opening: Limited Mouth Opening Comment: Submandibular abscess - [facility]  (+) Teeth Intact, Poor Dentition, Chipped, Missing and Dental Advisory Given   Pulmonary neg pulmonary ROS, Current Smoker,          Cardiovascular negative cardio ROS      Neuro/Psych    GI/Hepatic negative GI ROS, Neg liver ROS,   Endo/Other  negative endocrine ROS  Renal/GU negative Renal ROS     Musculoskeletal negative musculoskeletal ROS (+)   Abdominal   Peds  Hematology negative hematology ROS (+)   Anesthesia Other Findings   Reproductive/Obstetrics                       Anesthesia Physical Anesthesia Plan  ASA: II and emergent  Anesthesia Plan: General   Post-op Pain Management:    Induction: Intravenous  Airway Management Planned: Oral ETT  Additional Equipment:   Intra-op Plan:   Post-operative Plan: Extubation in OR  Informed Consent: I have reviewed the patients History and Physical, chart, labs and discussed the procedure including the risks, benefits and alternatives for the proposed anesthesia with the patient or authorized representative who has indicated his/her understanding and acceptance.   Dental advisory given  Plan Discussed with: CRNA, Anesthesiologist and Surgeon  Anesthesia Plan Comments:         Anesthesia Quick Evaluation

## 2013-05-11 NOTE — Anesthesia Postprocedure Evaluation (Signed)
  Anesthesia Post-op Note  Patient: Richard Mcfarland  Procedure(s) Performed: Procedure(s): INCISION AND DRAINAGE ABSCESS (Left)  Patient Location: PACU  Anesthesia Type:General  Level of Consciousness: awake  Airway and Oxygen Therapy: Patient Spontanous Breathing  Post-op Pain: mild  Post-op Assessment: Post-op Vital signs reviewed  Post-op Vital Signs: Reviewed  Complications: No apparent anesthesia complications

## 2013-05-11 NOTE — Preoperative (Signed)
Beta Blockers   Reason not to administer Beta Blockers:Not Applicable 

## 2013-05-11 NOTE — Progress Notes (Signed)
Subjective: Feeling a little better, minimal pain.  Objective: Vital signs in last 24 hours: Temp:  [99.1 F (37.3 C)-102.8 F (39.3 C)] 100.8 F (38.2 C) (12/28 0631) Pulse Rate:  [84-109] 87 (12/28 0631) Resp:  [14-21] 19 (12/28 0631) BP: (119-139)/(68-97) 122/85 mmHg (12/28 0631) SpO2:  [94 %-100 %] 96 % (12/28 0631) Weight:  [174 lb 3.2 oz (79.017 kg)-180 lb (81.647 kg)] 174 lb 3.2 oz (79.017 kg) (12/28 0152) Weight change:  Last BM Date: 05/03/13  Intake/Output from previous day: 12/27 0701 - 12/28 0700 In: 1026.3 [P.O.:120; I.V.:806.3; IV Piggyback:100] Out: 450 [Urine:300] Intake/Output this shift:    PHYSICAL EXAM: Neck much improved. Drainage persists - dressing changed.   Lab Results:  Recent Labs  05/10/13 2048  WBC 10.2  HGB 13.8  HCT 41.1  PLT 286   BMET  Recent Labs  05/10/13 2048  NA 129*  K 3.5  CL 90*  CO2 28  GLUCOSE 127*  BUN 7  CREATININE 0.78  CALCIUM 9.3    Studies/Results: Ct Soft Tissue Neck W Contrast  05/10/2013   CLINICAL DATA:  Tender enlarged lymph node on the left neck for 4 days. Fever.  EXAM: CT NECK WITH CONTRAST  TECHNIQUE: Multidetector CT imaging of the neck was performed using the standard protocol following the bolus administration of intravenous contrast.  CONTRAST:  OMNIPAQUE IOHEXOL 300 MG/ML  SOLN  COMPARISON:  None.  FINDINGS: The large heterogeneous mass is centered in the left submandibular space. Is contiguous with the lateral margin of the left submandibular gland, which is deviated medially. This appears to have arisen from the left submandibular gland. It is predominantly of low attenuation with irregular peripheral enhancement and areas of central reticular enhancement. The adjacent soft tissues are inflamed. The mass extends superiorly to abut the left [facility] term muscle and the inferior aspect of the superficial left parotid gland. There is only mild associated adenopathy. A left Parry jugular level 2 node  is 11 mm in short axis.  The right submandibular gland is unremarkable. The soft tissue edema extends across the submental space to the right submandibular space. Mass effect mildly deviates the or all pharynx and laryngeal structures to the right. The parapharyngeal spaces are relatively maintained. The nasopharynx is unremarkable. The mass measures 6.3 cm x 5.7 cm x 7.3 cm. It abuts the inferior aspect of the mandible mostly along its angle. No bone resorption is seen to suggest osteomyelitis.  The structures of the skullbase are unremarkable. Normal globes and orbits. The airway is widely patent. Edema extends to involve the submucosal space of the oral pharynx. No mucosal space mass is seen. The thyroid gland is unremarkable.  The lung apices are clear. The aortic arch is mildly dilated to a maximum of 3.6 cm.  There are mild degenerative changes along the mid cervical spine. No osteoblastic or osteolytic lesions.  IMPRESSION: 1. There is a large mass centered on the left submandibular space. This may have arisen from the lateral margin of the left submandibular gland. Given the history, a submandibular gland abscess is suspected. However, based on imaging alone, this could be a large necrotic mass, which would include neoplastic disease. 2. There is only mild associated left neck adenopathy. 3. There is surrounding soft tissue edema with edema extending to the sub mucosal space of the oral pharynx. Mass effect causes mild deviation of the oral pharynx and larynx to the right. No airway compromise.   Electronically Signed   By: Onalee Hua  Ormond M.D.   On: 05/10/2013 21:34    Medications: I have reviewed the patient's current medications.  Assessment/Plan: Post op 1 I&D left neck abscess. Continue IV Unasyn. Await culture results.    LOS: 1 day   Virginie Josten 05/11/2013, 9:19 AM

## 2013-05-12 NOTE — Progress Notes (Signed)
ENT Progress Note: POD # 2 s/p Procedure(s): INCISION AND DRAINAGE ABSCESS   Subjective: No complaints, min pain  Objective: Vital signs in last 24 hours: Temp:  [98.3 F (36.8 C)-99.5 F (37.5 C)] 98.4 F (36.9 C) (12/29 1357) Pulse Rate:  [62-82] 64 (12/29 1357) Resp:  [16-18] 18 (12/29 1357) BP: (125-136)/(80-101) 131/101 mmHg (12/29 1357) SpO2:  [97 %-99 %] 99 % (12/29 1357) Weight change:  Last BM Date: 05/10/13  Intake/Output from previous day: 12/28 0701 - 12/29 0700 In: 1025 [I.V.:825; IV Piggyback:200] Out: 1600 [Urine:1600] Intake/Output this shift: Total I/O In: 360 [P.O.:360] Out: -   Labs:  Recent Labs  05/10/13 2048  WBC 10.2  HGB 13.8  HCT 41.1  PLT 286    Recent Labs  05/10/13 2048  NA 129*  K 3.5  CL 90*  CO2 28  GLUCOSE 127*  BUN 7  CALCIUM 9.3    Studies/Results: Ct Soft Tissue Neck W Contrast  05/10/2013   CLINICAL DATA:  Tender enlarged lymph node on the left neck for 4 days. Fever.  EXAM: CT NECK WITH CONTRAST  TECHNIQUE: Multidetector CT imaging of the neck was performed using the standard protocol following the bolus administration of intravenous contrast.  CONTRAST:  OMNIPAQUE IOHEXOL 300 MG/ML  SOLN  COMPARISON:  None.  FINDINGS: The large heterogeneous mass is centered in the left submandibular space. Is contiguous with the lateral margin of the left submandibular gland, which is deviated medially. This appears to have arisen from the left submandibular gland. It is predominantly of low attenuation with irregular peripheral enhancement and areas of central reticular enhancement. The adjacent soft tissues are inflamed. The mass extends superiorly to abut the left [facility] term muscle and the inferior aspect of the superficial left parotid gland. There is only mild associated adenopathy. A left Parry jugular level 2 node is 11 mm in short axis.  The right submandibular gland is unremarkable. The soft tissue edema extends across  the submental space to the right submandibular space. Mass effect mildly deviates the or all pharynx and laryngeal structures to the right. The parapharyngeal spaces are relatively maintained. The nasopharynx is unremarkable. The mass measures 6.3 cm x 5.7 cm x 7.3 cm. It abuts the inferior aspect of the mandible mostly along its angle. No bone resorption is seen to suggest osteomyelitis.  The structures of the skullbase are unremarkable. Normal globes and orbits. The airway is widely patent. Edema extends to involve the submucosal space of the oral pharynx. No mucosal space mass is seen. The thyroid gland is unremarkable.  The lung apices are clear. The aortic arch is mildly dilated to a maximum of 3.6 cm.  There are mild degenerative changes along the mid cervical spine. No osteoblastic or osteolytic lesions.  IMPRESSION: 1. There is a large mass centered on the left submandibular space. This may have arisen from the lateral margin of the left submandibular gland. Given the history, a submandibular gland abscess is suspected. However, based on imaging alone, this could be a large necrotic mass, which would include neoplastic disease. 2. There is only mild associated left neck adenopathy. 3. There is surrounding soft tissue edema with edema extending to the sub mucosal space of the oral pharynx. Mass effect causes mild deviation of the oral pharynx and larynx to the right. No airway compromise.   Electronically Signed   By: Amie Portland M.D.   On: 05/10/2013 21:34     PHYSICAL EXAM: Drains in-place, cont mod  d/c Cont swelling and mild erythema   Assessment/Plan: Pt stable C/S from surgery pending Cont IV abx Consider d/c 12/31 if cont clinical improvement    Richard Mcfarland 05/12/2013, 5:56 PM

## 2013-05-13 ENCOUNTER — Encounter (HOSPITAL_COMMUNITY): Payer: Self-pay | Admitting: Otolaryngology

## 2013-05-13 MED ORDER — AMOXICILLIN-POT CLAVULANATE 500-125 MG PO TABS: 1.0000 | ORAL_TABLET | Freq: Two times a day (BID) | ORAL | Status: DC

## 2013-05-13 NOTE — Progress Notes (Signed)
   ENT Progress Note: POD #3  s/p Procedure(s): INCISION AND DRAINAGE ABSCESS   Subjective: No pain, improved swelling  Objective: Vital signs in last 24 hours: Temp:  [98.1 F (36.7 C)-98.4 F (36.9 C)] 98.4 F (36.9 C) (12/30 1333) Pulse Rate:  [56-103] 103 (12/30 1333) Resp:  [16-18] 18 (12/30 1333) BP: (124-138)/(84-96) 132/96 mmHg (12/30 1333) SpO2:  [99 %] 99 % (12/30 1333) Weight change:  Last BM Date: 05/12/13  Intake/Output from previous day: 12/29 0701 - 12/30 0700 In: 360 [P.O.:360] Out: 875 [Urine:875] Intake/Output this shift:    Labs:  Recent Labs  05/10/13 2048  WBC 10.2  HGB 13.8  HCT 41.1  PLT 286    Recent Labs  05/10/13 2048  NA 129*  K 3.5  CL 90*  CO2 28  GLUCOSE 127*  BUN 7  CALCIUM 9.3    Studies/Results: No results found.   PHYSICAL EXAM: Drain out, min d/c Improving swelling   Assessment/Plan: Pt stable and improving C/S shows coag neg staph, sens. Pending Clinical improvement, d/c on augmentin    Lakiya Cottam 05/13/2013, 6:25 PM

## 2013-05-13 NOTE — Discharge Summary (Signed)
Physician Discharge Summary  Patient ID: Richard Mcfarland MRN: [id number] DOB/AGE: Oct 16, 1960 52 y.o.  Admit date: 05/10/2013 Discharge date: 05/13/2013  Admission Diagnoses:  Active Problems:   Neck abscess   Discharge Diagnoses:  Same  Surgeries: Procedure(s): INCISION AND DRAINAGE ABSCESS on 05/10/2013 - 05/11/2013   Consultants: None  Discharged Condition: Improved  Hospital Course: Richard Mcfarland is an 52 y.o. male who was admitted 05/10/2013 with a diagnosis of Left neck abscess and went to the operating room on 05/10/2013 - 05/11/2013 and underwent the above named procedures.   Pt stable with gradual decrease in d/c and swelling.  Recent vital signs:  Filed Vitals:   05/13/13 1333  BP: 132/96  Pulse: 103  Temp: 98.4 F (36.9 C)  Resp: 18    Recent laboratory studies:  Results for orders placed during the hospital encounter of 05/10/13  CULTURE, ROUTINE-ABSCESS      Result Value Range   Specimen Description ABSCESS LEFT NECK     Special Requests NONE     Gram Stain       Value: MODERATE WBC PRESENT, PREDOMINANTLY PMN     NO SQUAMOUS EPITHELIAL CELLS SEEN     FEW GRAM POSITIVE RODS     Performed at [facility]   Culture       Value: FEW STAPHYLOCOCCUS SPECIES (COAGULASE NEGATIVE)     Performed at [facility]   Report Status PENDING    ANAEROBIC CULTURE      Result Value Range   Specimen Description ABSCESS LEFT NECK     Special Requests NONE     Gram Stain       Value: MODERATE WBC PRESENT, PREDOMINANTLY PMN     NO SQUAMOUS EPITHELIAL CELLS SEEN     FEW GRAM POSITIVE RODS     Performed at [facility]   Culture       Value: NO ANAEROBES ISOLATED; CULTURE IN PROGRESS FOR 5 DAYS     Performed at [facility]   Report Status PENDING    CBC WITH DIFFERENTIAL      Result Value Range   WBC 10.2  4.0 - 10.5 K/uL   RBC 4.54  4.22 - 5.81 MIL/uL   Hemoglobin 13.8  13.0 - 17.0 g/dL   HCT 57.8  46.9 - 62.9 %   MCV  90.5  78.0 - 100.0 fL   MCH 30.4  26.0 - 34.0 pg   MCHC 33.6  30.0 - 36.0 g/dL   RDW 52.8  41.3 - 24.4 %   Platelets 286  150 - 400 K/uL   Neutrophils Relative % 72  43 - 77 %   Neutro Abs 7.4  1.7 - 7.7 K/uL   Lymphocytes Relative 13  12 - 46 %   Lymphs Abs 1.3  0.7 - 4.0 K/uL   Monocytes Relative 14 (*) 3 - 12 %   Monocytes Absolute 1.5 (*) 0.1 - 1.0 K/uL   Eosinophils Relative 0  0 - 5 %   Eosinophils Absolute 0.0  0.0 - 0.7 K/uL   Basophils Relative 0  0 - 1 %   Basophils Absolute 0.0  0.0 - 0.1 K/uL  BASIC METABOLIC PANEL      Result Value Range   Sodium 129 (*) 135 - 145 mEq/L   Potassium 3.5  3.5 - 5.1 mEq/L   Chloride 90 (*) 96 - 112 mEq/L   CO2 28  19 - 32 mEq/L   Glucose, Bld 127 (*)  70 - 99 mg/dL   BUN 7  6 - 23 mg/dL   Creatinine, Ser 0.27  0.50 - 1.35 mg/dL   Calcium 9.3  8.4 - 25.3 mg/dL   GFR calc non Af Amer >90  >90 mL/min   GFR calc Af Amer >90  >90 mL/min    Discharge Medications:     Medication List    STOP taking these medications       acetaminophen 500 MG tablet  Commonly known as:  TYLENOL     ALKA-SELTZER PLUS COLD & COUGH 7.12-14-08-325 MG Tbef  Generic drug:  Phenyleph-CPM-DM-Aspirin      TAKE these medications       amoxicillin-clavulanate 500-125 MG per tablet  Commonly known as:  AUGMENTIN  Take 1 tablet (500 mg total) by mouth 2 (two) times daily.        Diagnostic Studies: Ct Soft Tissue Neck W Contrast  05/10/2013   CLINICAL DATA:  Tender enlarged lymph node on the left neck for 4 days. Fever.  EXAM: CT NECK WITH CONTRAST  TECHNIQUE: Multidetector CT imaging of the neck was performed using the standard protocol following the bolus administration of intravenous contrast.  CONTRAST:  OMNIPAQUE IOHEXOL 300 MG/ML  SOLN  COMPARISON:  None.  FINDINGS: The large heterogeneous mass is centered in the left submandibular space. Is contiguous with the lateral margin of the left submandibular gland, which is deviated medially. This appears  to have arisen from the left submandibular gland. It is predominantly of low attenuation with irregular peripheral enhancement and areas of central reticular enhancement. The adjacent soft tissues are inflamed. The mass extends superiorly to abut the left [facility] term muscle and the inferior aspect of the superficial left parotid gland. There is only mild associated adenopathy. A left Parry jugular level 2 node is 11 mm in short axis.  The right submandibular gland is unremarkable. The soft tissue edema extends across the submental space to the right submandibular space. Mass effect mildly deviates the or all pharynx and laryngeal structures to the right. The parapharyngeal spaces are relatively maintained. The nasopharynx is unremarkable. The mass measures 6.3 cm x 5.7 cm x 7.3 cm. It abuts the inferior aspect of the mandible mostly along its angle. No bone resorption is seen to suggest osteomyelitis.  The structures of the skullbase are unremarkable. Normal globes and orbits. The airway is widely patent. Edema extends to involve the submucosal space of the oral pharynx. No mucosal space mass is seen. The thyroid gland is unremarkable.  The lung apices are clear. The aortic arch is mildly dilated to a maximum of 3.6 cm.  There are mild degenerative changes along the mid cervical spine. No osteoblastic or osteolytic lesions.  IMPRESSION: 1. There is a large mass centered on the left submandibular space. This may have arisen from the lateral margin of the left submandibular gland. Given the history, a submandibular gland abscess is suspected. However, based on imaging alone, this could be a large necrotic mass, which would include neoplastic disease. 2. There is only mild associated left neck adenopathy. 3. There is surrounding soft tissue edema with edema extending to the sub mucosal space of the oral pharynx. Mass effect causes mild deviation of the oral pharynx and larynx to the right. No airway compromise.    Electronically Signed   By: Amie Portland M.D.   On: 05/10/2013 21:34    Disposition: 01-Home or Self Care      Discharge Orders   Future  Orders Complete By Expires   Diet - low sodium heart healthy  As directed    Discharge instructions  As directed    Comments:     1. Limited activity 2. Liquid and soft diet, advance as tolerated 3. May bathe and shower, keep incision dry for 3 days postop 4. Wound care - 1/2 str H2O2 and bacitracin ointment twice daily 5. Elevate Head of Bed   Increase activity slowly  As directed       Follow-up Information   Follow up with Serena Colonel, MD. Schedule an appointment as soon as possible for a visit in 10 days.   Specialty:  Otolaryngology   Contact information:   [address] [place] [address] [phone]        Signed: Osborn Coho 05/13/2013, 6:30 PM

## 2013-05-15 LAB — CULTURE, ROUTINE-ABSCESS

## 2013-05-16 LAB — ANAEROBIC CULTURE

## 2014-05-24 ENCOUNTER — Emergency Department (HOSPITAL_COMMUNITY)
Admission: EM | Admit: 2014-05-24 | Discharge: 2014-05-24 | Disposition: A | Discharge status: Home / Self Care | Payer: Self-pay | Attending: Emergency Medicine | Admitting: Emergency Medicine

## 2014-05-24 ENCOUNTER — Encounter (HOSPITAL_COMMUNITY): Payer: Self-pay | Admitting: Emergency Medicine

## 2014-05-24 DIAGNOSIS — Z792 Long term (current) use of antibiotics: Secondary | ICD-10-CM | POA: Insufficient documentation

## 2014-05-24 DIAGNOSIS — L02811 Cutaneous abscess of head [any part, except face]: Secondary | ICD-10-CM | POA: Insufficient documentation

## 2014-05-24 DIAGNOSIS — R21 Rash and other nonspecific skin eruption: Secondary | ICD-10-CM | POA: Insufficient documentation

## 2014-05-24 DIAGNOSIS — L0291 Cutaneous abscess, unspecified: Secondary | ICD-10-CM

## 2014-05-24 LAB — BASIC METABOLIC PANEL
Anion gap: 8 (ref 5–15)
BUN: 8 mg/dL (ref 6–23)
CO2: 27 mmol/L (ref 19–32)
Calcium: 9.2 mg/dL (ref 8.4–10.5)
Chloride: 98 mEq/L (ref 96–112)
Creatinine, Ser: 0.64 mg/dL (ref 0.50–1.35)
GFR calc Af Amer: >90 mL/min (ref >90)
GFR calc non Af Amer: >90 mL/min (ref >90)
GLUCOSE: 102 mg/dL — AB (ref 70–99)
Potassium: 4.2 mmol/L (ref 3.5–5.1)
Sodium: 133 mmol/L — ABNORMAL LOW (ref 135–145)

## 2014-05-24 LAB — CBC WITH DIFFERENTIAL/PLATELET
BASOS PCT: 1 % (ref 0–1)
Basophils Absolute: 0.1 10*3/uL (ref 0.0–0.1)
Eosinophils Absolute: 0.2 10*3/uL (ref 0.0–0.7)
Eosinophils Relative: 2 % (ref 0–5)
HEMATOCRIT: 43 % (ref 39.0–52.0)
Hemoglobin: 14.4 g/dL (ref 13.0–17.0)
LYMPHS PCT: 15 % (ref 12–46)
Lymphs Abs: 1.6 10*3/uL (ref 0.7–4.0)
MCH: 30.1 pg (ref 26.0–34.0)
MCHC: 33.5 g/dL (ref 30.0–36.0)
MCV: 89.8 fL (ref 78.0–100.0)
Monocytes Absolute: 1.4 10*3/uL — ABNORMAL HIGH (ref 0.1–1.0)
Monocytes Relative: 13 % — ABNORMAL HIGH (ref 3–12)
NEUTROS ABS: 7.7 10*3/uL (ref 1.7–7.7)
NEUTROS PCT: 71 % (ref 43–77)
Platelets: 245 10*3/uL (ref 150–400)
RBC: 4.79 MIL/uL (ref 4.22–5.81)
RDW: 12.8 % (ref 11.5–15.5)
WBC: 10.9 10*3/uL — AB (ref 4.0–10.5)

## 2014-05-24 MED ORDER — SULFAMETHOXAZOLE-TRIMETHOPRIM 800-160 MG PO TABS: 1.0000 | ORAL_TABLET | Freq: Two times a day (BID) | ORAL | Status: DC

## 2014-05-24 MED ORDER — LIDOCAINE HCL (PF) 1 % IJ SOLN
INTRAMUSCULAR | Status: AC
  Administered 2014-05-24: 15:00:00
  Filled 2014-05-24: qty 5

## 2014-05-24 MED ORDER — VANCOMYCIN HCL IN DEXTROSE 1-5 GM/200ML-% IV SOLN
1000.0000 mg | Freq: Once | INTRAVENOUS | Status: AC
  Administered 2014-05-24: 1000 mg via INTRAVENOUS
  Filled 2014-05-24: qty 200

## 2014-05-24 NOTE — ED Notes (Signed)
Patient c/o question bug bites to arms, legs, abd , and back. Per patient appeared three days ago. Per patient itching. Reports serosanguineous drainage. No drainage noted at this time. Denies any fevers.

## 2014-05-24 NOTE — Discharge Instructions (Signed)
Follow up in two days for recheck °

## 2014-05-24 NOTE — ED Provider Notes (Signed)
CSN: [id number]     Arrival date & time 05/24/14  1218 History   First MD Initiated Contact with Patient 05/24/14 1406     Chief Complaint  Patient presents with  . Insect Bite     (Consider location/radiation/quality/duration/timing/severity/associated sxs/prior Treatment) Patient is a 54 y.o. male presenting with rash. The history is provided by the patient (pt complains of a rash to arms and legs and swelling to back of head).  Rash Location:  Shoulder/arm (pt has swelling to back of head) Shoulder/arm rash location:  L arm and R arm Quality: not blistering   Severity:  Mild Onset quality:  Gradual Timing:  Constant Progression:  Worsening Associated symptoms: no abdominal pain, no diarrhea, no fatigue and no headaches     History reviewed. No pertinent past medical history. Past Surgical History  Procedure Laterality Date  . Incision and drainage abscess Left 05/11/2013    Procedure: INCISION AND DRAINAGE ABSCESS;  Surgeon: Serena ColonelJefry Rosen, MD;  [facility];  Service: ENT;  Laterality: Left;   Family History  Problem Relation Age of Onset  . Diabetes Other    History  Substance Use Topics  . Smoking status: Current Every Day Smoker -- 0.05 packs/day for 30 years    Types: Cigarettes  . Smokeless tobacco: Never Used  . Alcohol Use: Yes     Comment: heavily    Review of Systems  Constitutional: Negative for appetite change and fatigue.  HENT: Negative for congestion, ear discharge and sinus pressure.   Eyes: Negative for discharge.  Respiratory: Negative for cough.   Cardiovascular: Negative for chest pain.  Gastrointestinal: Negative for abdominal pain and diarrhea.  Genitourinary: Negative for frequency and hematuria.  Musculoskeletal: Negative for back pain.  Skin: Positive for rash.  Neurological: Negative for seizures and headaches.  Psychiatric/Behavioral: Negative for hallucinations.      Allergies  Review of patient's allergies indicates no known  allergies.  Home Medications   Prior to Admission medications   Medication Sig Start Date End Date Taking? Authorizing Provider  amoxicillin-clavulanate (AUGMENTIN) 500-125 MG per tablet Take 1 tablet (500 mg total) by mouth 2 (two) times daily. 05/13/13   Osborn Cohoavid Shoemaker, MD  sulfamethoxazole-trimethoprim (SEPTRA DS) 800-160 MG per tablet Take 1 tablet by mouth 2 (two) times daily. 05/24/14   Benny LennertJoseph L Danylah Holden, MD   BP 143/98 mmHg  Pulse 84  Temp(Src) 99.5 F (37.5 C) (Oral)  Resp 18  Ht 6' (1.829 m)  Wt 175 lb (79.379 kg)  BMI 23.73 kg/m2  SpO2 100% Physical Exam  Constitutional: He is oriented to person, place, and time. He appears well-developed.  HENT:  Head: Normocephalic.  Pt has a 3cm by 3cm abscess to occiptial head  Eyes: Conjunctivae and EOM are normal. No scleral icterus.  Neck: Neck supple. No thyromegaly present.  Cardiovascular: Normal rate and regular rhythm.  Exam reveals no gallop and no friction rub.   No murmur heard. Pulmonary/Chest: No stridor. He has no wheezes. He has no rales. He exhibits no tenderness.  Abdominal: He exhibits no distension. There is no tenderness. There is no rebound.  Musculoskeletal: Normal range of motion. He exhibits no edema.  Lymphadenopathy:    He has no cervical adenopathy.  Neurological: He is oriented to person, place, and time. He exhibits normal muscle tone. Coordination normal.  Skin: Rash noted. There is erythema.  Mac pap rash to arms and legs  Psychiatric: He has a normal mood and affect. His behavior is normal.  ED Course  INCISION AND DRAINAGE Date/Time: 05/24/2014 4:13 PM Performed by: Torence Palmeri L Authorized by: Bethann Berkshire L Comments: 3cm by 3 cm abscess to back of head,   Lido no epi.  #11 blade used to make incission.   Pus removed.  cx done,   Packing done   (including critical care time) Labs Review Labs Reviewed  CBC WITH DIFFERENTIAL - Abnormal; Notable for the following:    WBC 10.9 (*)     Monocytes Relative 13 (*)    Monocytes Absolute 1.4 (*)    All other components within normal limits  BASIC METABOLIC PANEL - Abnormal; Notable for the following:    Sodium 133 (*)    Glucose, Bld 102 (*)    All other components within normal limits  WOUND CULTURE    Imaging Review No results found.   EKG Interpretation None      MDM   Final diagnoses:  Abscess    Abscess,  Probable mrsa,   tx with i and d and bactrim    Benny Lennert, MD 05/24/14 1615

## 2014-05-26 ENCOUNTER — Encounter (HOSPITAL_COMMUNITY): Payer: Self-pay | Admitting: Cardiology

## 2014-05-26 ENCOUNTER — Emergency Department (HOSPITAL_COMMUNITY)
Admission: EM | Admit: 2014-05-26 | Discharge: 2014-05-26 | Disposition: A | Discharge status: Home / Self Care | Payer: Self-pay | Attending: Emergency Medicine | Admitting: Emergency Medicine

## 2014-05-26 DIAGNOSIS — L02811 Cutaneous abscess of head [any part, except face]: Secondary | ICD-10-CM | POA: Insufficient documentation

## 2014-05-26 DIAGNOSIS — Z720 Tobacco use: Secondary | ICD-10-CM | POA: Insufficient documentation

## 2014-05-26 DIAGNOSIS — L0291 Cutaneous abscess, unspecified: Secondary | ICD-10-CM

## 2014-05-26 DIAGNOSIS — Z792 Long term (current) use of antibiotics: Secondary | ICD-10-CM | POA: Insufficient documentation

## 2014-05-26 NOTE — ED Notes (Addendum)
Here for wound recheck.  Had I and D of abscess on back of head.  Also red raised area to left side of face ,  That he wanted rechecked.

## 2014-05-26 NOTE — ED Provider Notes (Signed)
CSN: [id number]     Arrival date & time 05/26/14  1041 History   First MD Initiated Contact with Patient 05/26/14 1051     Chief Complaint  Patient presents with  . Wound Check     (Consider location/radiation/quality/duration/timing/severity/associated sxs/prior Treatment) HPI Comments: Pt comes in for wound recheck of I&D on 1/10. Pt states that overall the area feels a lot better. Is taking antibiotics. No fever.   The history is provided by the patient. No language interpreter was used.    History reviewed. No pertinent past medical history. Past Surgical History  Procedure Laterality Date  . Incision and drainage abscess Left 05/11/2013    Procedure: INCISION AND DRAINAGE ABSCESS;  Surgeon: Serena ColonelJefry Rosen, MD;  [facility];  Service: ENT;  Laterality: Left;   Family History  Problem Relation Age of Onset  . Diabetes Other    History  Substance Use Topics  . Smoking status: Current Every Day Smoker -- 0.05 packs/day for 30 years    Types: Cigarettes  . Smokeless tobacco: Never Used  . Alcohol Use: Yes     Comment: heavily    Review of Systems  Constitutional: Negative.   Respiratory: Negative.   Cardiovascular: Negative.       Allergies  Review of patient's allergies indicates no known allergies.  Home Medications   Prior to Admission medications   Medication Sig Start Date End Date Taking? Authorizing Provider  amoxicillin-clavulanate (AUGMENTIN) 500-125 MG per tablet Take 1 tablet (500 mg total) by mouth 2 (two) times daily. 05/13/13   Osborn Cohoavid Shoemaker, MD  sulfamethoxazole-trimethoprim (SEPTRA DS) 800-160 MG per tablet Take 1 tablet by mouth 2 (two) times daily. 05/24/14   Benny LennertJoseph L Zammit, MD   BP 153/106 mmHg  Pulse 91  Temp(Src) 99 F (37.2 C) (Oral)  Resp 16  Ht 6' (1.829 m)  Wt 175 lb (79.379 kg)  BMI 23.73 kg/m2  SpO2 100% Physical Exam  Constitutional: He is oriented to person, place, and time. He appears well-developed and well-nourished.   Cardiovascular: Normal rate and regular rhythm.   Pulmonary/Chest: Effort normal and breath sounds normal.  Neurological: He is alert and oriented to person, place, and time.  Skin:  Mild redness noted around wound. No drainage noted. Packing removed  Nursing note and vitals reviewed.   ED Course  Procedures (including critical care time) Labs Review Labs Reviewed - No data to display  Imaging Review No results found.   EKG Interpretation None      MDM   Final diagnoses:  Abscess    Packing removed. Discussed return precautions    Teressa LowerVrinda Jimie Kuwahara, NP 05/26/14 1329  Samuel JesterKathleen McManus, DO 05/28/14 1346

## 2014-05-26 NOTE — Discharge Instructions (Signed)
Follow up or return here as needed  For worsening or continued symptoms Abscess An abscess is an infected area that contains a collection of pus and debris.It can occur in almost any part of the body. An abscess is also known as a furuncle or boil. CAUSES  An abscess occurs when tissue gets infected. This can occur from blockage of oil or sweat glands, infection of hair follicles, or a minor injury to the skin. As the body tries to fight the infection, pus collects in the area and creates pressure under the skin. This pressure causes pain. People with weakened immune systems have difficulty fighting infections and get certain abscesses more often.  SYMPTOMS Usually an abscess develops on the skin and becomes a painful mass that is red, warm, and tender. If the abscess forms under the skin, you may feel a moveable soft area under the skin. Some abscesses break open (rupture) on their own, but most will continue to get worse without care. The infection can spread deeper into the body and eventually into the bloodstream, causing you to feel ill.  DIAGNOSIS  Your caregiver will take your medical history and perform a physical exam. A sample of fluid may also be taken from the abscess to determine what is causing your infection. TREATMENT  Your caregiver may prescribe antibiotic medicines to fight the infection. However, taking antibiotics alone usually does not cure an abscess. Your caregiver may need to make a small cut (incision) in the abscess to drain the pus. In some cases, gauze is packed into the abscess to reduce pain and to continue draining the area. HOME CARE INSTRUCTIONS   Only take over-the-counter or prescription medicines for pain, discomfort, or fever as directed by your caregiver.  If you were prescribed antibiotics, take them as directed. Finish them even if you start to feel better.  If gauze is used, follow your caregiver's directions for changing the gauze.  To avoid spreading  the infection:  Keep your draining abscess covered with a bandage.  Wash your hands well.  Do not share personal care items, towels, or whirlpools with others.  Avoid skin contact with others.  Keep your skin and clothes clean around the abscess.  Keep all follow-up appointments as directed by your caregiver. SEEK MEDICAL CARE IF:   You have increased pain, swelling, redness, fluid drainage, or bleeding.  You have muscle aches, chills, or a general ill feeling.  You have a fever. MAKE SURE YOU:   Understand these instructions.  Will watch your condition.  Will get help right away if you are not doing well or get worse. Document Released: 02/08/2005 Document Revised: 10/31/2011 Document Reviewed: 07/14/2011 [facility] Patient Information 2015 [place]. This information is not intended to replace advice given to you by your health care provider. Make sure you discuss any questions you have with your health care provider.

## 2014-05-27 ENCOUNTER — Telehealth (HOSPITAL_BASED_OUTPATIENT_CLINIC_OR_DEPARTMENT_OTHER): Payer: Self-pay | Admitting: Emergency Medicine

## 2014-05-27 LAB — WOUND CULTURE

## 2014-05-27 NOTE — Telephone Encounter (Signed)
Post ED Visit - Positive Culture Follow-up  Culture report reviewed by antimicrobial stewardship pharmacist: []  Wes Dulaney, Pharm.D., BCPS []  Celedonio MiyamotoJeremy Frens, Pharm.D., BCPS []  Georgina PillionElizabeth Martin, Pharm.D., BCPS []  [place]h Pham, [address]Ph[address]D., BCPS, AAHIVP []  Estella HuskMichelle Turner, Pharm.D., BCPS, AAHIVP [x]  Elder CyphersLorie Poole, [address]Ph[address]D., BCPS  Positive MRSA culture Treated with sulfamethoxazole-trimethoprim bid x 14 days, organism sensitive to the same and no further patient follow-up is required at this time.  Berle MullMiller, Tayra Dawe 05/27/2014, 3:41 PM

## 2014-05-28 ENCOUNTER — Telehealth (HOSPITAL_BASED_OUTPATIENT_CLINIC_OR_DEPARTMENT_OTHER): Payer: Self-pay | Admitting: Emergency Medicine

## 2014-05-28 NOTE — Telephone Encounter (Signed)
Post ED Visit - Positive Culture Follow-up  Culture report reviewed by antimicrobial stewardship pharmacist: []  Wes Dulaney, Pharm.D., BCPS []  Celedonio MiyamotoJeremy Frens, Pharm.D., BCPS []  Georgina PillionElizabeth Martin, Pharm.D., BCPS []  [place] JebelMinh Pham, [address]Pharm[address]D., BCPS, AAHIVP []  Estella HuskMichelle Turner, Pharm.D., BCPS, AAHIVP [x]  Elder CyphersLorie Poole, [address]Ph[address]D., BCPS  Positive wound abcess  culture Treated with MRSA , organism sensitive to the same and no further patient follow-up is required at this time.  Berle MullMiller, Orlandis Sanden 05/28/2014, 3:29 PM

## 2014-06-02 ENCOUNTER — Emergency Department (HOSPITAL_COMMUNITY)
Admission: EM | Admit: 2014-06-02 | Discharge: 2014-06-02 | Disposition: A | Discharge status: Home / Self Care | Payer: Self-pay | Attending: Family Medicine | Admitting: Family Medicine

## 2014-06-02 ENCOUNTER — Encounter (HOSPITAL_COMMUNITY): Payer: Self-pay | Admitting: Physical Medicine and Rehabilitation

## 2014-06-02 DIAGNOSIS — Z792 Long term (current) use of antibiotics: Secondary | ICD-10-CM | POA: Insufficient documentation

## 2014-06-02 DIAGNOSIS — B86 Scabies: Secondary | ICD-10-CM | POA: Insufficient documentation

## 2014-06-02 DIAGNOSIS — R000 Tachycardia, unspecified: Secondary | ICD-10-CM | POA: Insufficient documentation

## 2014-06-02 DIAGNOSIS — Z720 Tobacco use: Secondary | ICD-10-CM | POA: Insufficient documentation

## 2014-06-02 MED ORDER — HYDROXYZINE HCL 25 MG PO TABS: 25.0000 mg | ORAL_TABLET | Freq: Three times a day (TID) | ORAL | Status: DC | PRN

## 2014-06-02 MED ORDER — PERMETHRIN 5 % EX CREA: TOPICAL_CREAM | CUTANEOUS | Status: DC

## 2014-06-02 NOTE — ED Notes (Signed)
[place] this past christ[place] and has noticed bites on his arms and lower abdomen.  Pt also reports having a bite on his posterior head drained a few weeks ago.

## 2014-06-02 NOTE — Discharge Instructions (Signed)

## 2014-06-02 NOTE — ED Notes (Signed)
Pt made aware to return if symptoms worsen or if any life threatening symptoms occur.   

## 2014-06-02 NOTE — ED Provider Notes (Signed)
CSN: [id number]     Arrival date & time 06/02/14  1459 History   First MD Initiated Contact with Patient 06/02/14 1532     Chief Complaint  Patient presents with  . Rash     (Consider location/radiation/quality/duration/timing/severity/associated sxs/prior Treatment) HPI Comments: Patient reports he developed a pruritic rash after moving down to [place] to live with friend at the end of Dec. 2015. States rash is very pruritic and began on arms and torso and seems to be spreading. Has not tried any treatment for this. [facility]. Underwent I&D and is currently taking TMP/SMX for abscess associated scalp cellulitis.  Reports himself to be otherwise healthy. PCP: none Denies previous episodes of similar rash. No mucous membrane lesions No fever, chills, blisters.  The history is provided by the patient.    History reviewed. No pertinent past medical history. Past Surgical History  Procedure Laterality Date  . Incision and drainage abscess Left 05/11/2013    Procedure: INCISION AND DRAINAGE ABSCESS;  Surgeon: Serena ColonelJefry Rosen, MD;  [facility];  Service: ENT;  Laterality: Left;   Family History  Problem Relation Age of Onset  . Diabetes Other    History  Substance Use Topics  . Smoking status: Current Every Day Smoker -- 0.05 packs/day for 30 years    Types: Cigarettes  . Smokeless tobacco: Never Used  . Alcohol Use: Yes     Comment: heavily    Review of Systems  All other systems reviewed and are negative.     Allergies  Review of patient's allergies indicates no known allergies.  Home Medications   Prior to Admission medications   Medication Sig Start Date End Date Taking? Authorizing Provider  amoxicillin-clavulanate (AUGMENTIN) 500-125 MG per tablet Take 1 tablet (500 mg total) by mouth 2 (two) times daily. 05/13/13   Osborn Cohoavid Shoemaker, MD  permethrin (ELIMITE) 5 % cream Apply to affected area once, leave 8 hours then  shower. Repeat same treatment x 1 in 7 days 06/02/14   Ria ClockJennifer Lee H Destaney Sarkis, PA  sulfamethoxazole-trimethoprim (SEPTRA DS) 800-160 MG per tablet Take 1 tablet by mouth 2 (two) times daily. 05/24/14   Benny LennertJoseph L Zammit, MD   BP 134/99 mmHg  Pulse 111  Temp(Src) 98.7 F (37.1 C) (Oral)  Resp 18  SpO2 99% Physical Exam  Constitutional: He is oriented to person, place, and time. He appears well-developed and well-nourished. No distress.  HENT:  Head: Normocephalic and atraumatic.  Mouth/Throat: Oropharynx is clear and moist.  Eyes: Conjunctivae are normal. No scleral icterus.  Cardiovascular: Regular rhythm and normal heart sounds.   +mild tachycardia  Pulmonary/Chest: Effort normal.  Musculoskeletal: Normal range of motion.  Neurological: He is alert and oriented to person, place, and time.  Skin: Skin is warm and dry. Rash noted. No erythema.  Discrete erythematous maculopapular rash on bilateral upper extremities with few scattered urticarial lesions. Many of the lesions have superficial excoriations from scratching.   Psychiatric: He has a normal mood and affect. His behavior is normal.  Nursing note and vitals reviewed.   ED Course  Procedures (including critical care time) Labs Review Labs Reviewed - No data to display  Imaging Review No results found.   EKG Interpretation None      MDM   Final diagnoses:  Scabies  Elimite and atarax as directed with PCP follow up if no improvement. Advised to provide information to other members of household.  Ria Clock, [place] 06/02/14 [phone]

## 2014-06-02 NOTE — ED Notes (Signed)
Pt presents to department for evaluation of rash and itching all over body. Ongoing for several weeks. Respirations unlabored. Pt is alert and oriented x4. NAD.

## 2015-02-19 IMAGING — CT CT NECK W/ CM
3 of 4 series · 13 of 33 positions shown, 16 images · IV contrast (Omnipaque 300)
Comparison: None.

CLINICAL DATA: Tender enlarged lymph node on the left neck for 4
days. Fever.

EXAM:
CT NECK WITH CONTRAST
TECHNIQUE: Multidetector CT imaging of the neck was performed using the
standard protocol following the bolus administration of intravenous
contrast.
CONTRAST:  100mL OMNIPAQUE IOHEXOL 300 MG/ML  SOLN

[Series 2: soft tissue neck 2.0 b31s · axial · 0.52mm/px · z∈[+1050,+1214]mm · 5 of 124 slices shown, 7 images]
[im 21/124  soft-tissue]
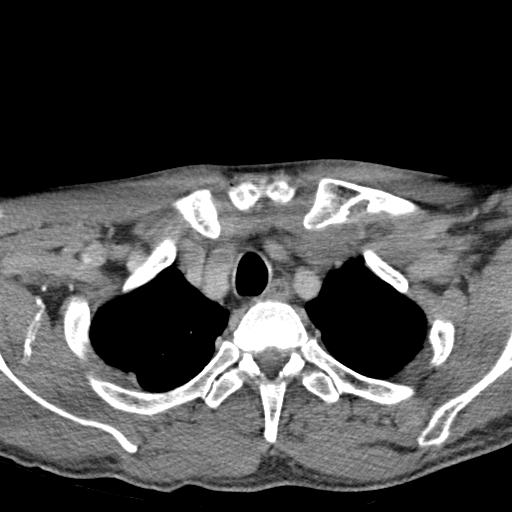
[im 21/124  bone]
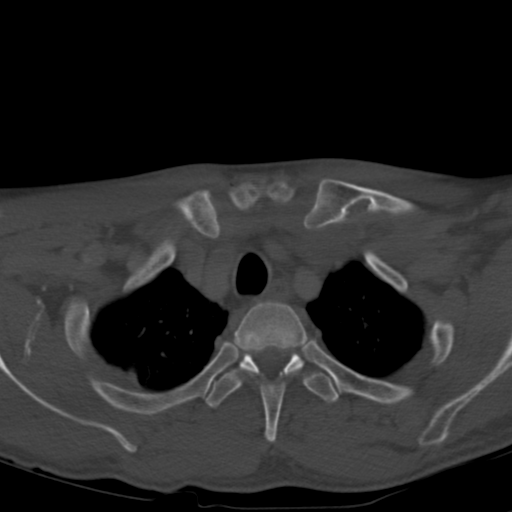
[im 42/124  bone]
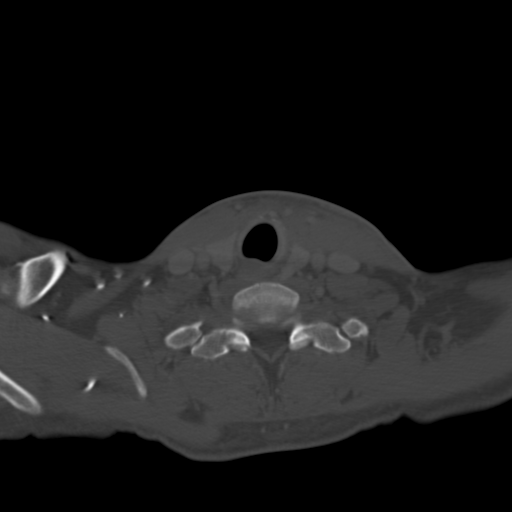
[im 62/124  bone]
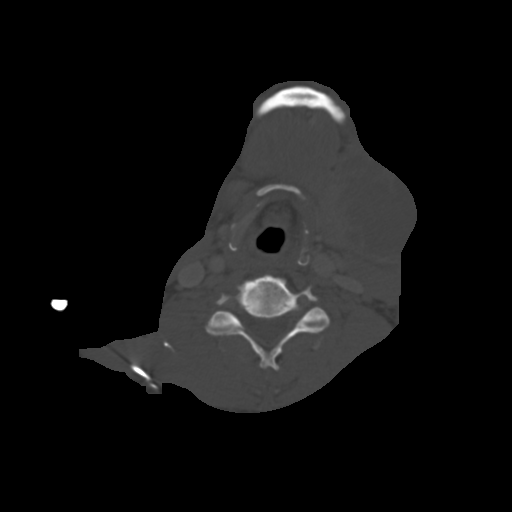
[im 83/124  bone]
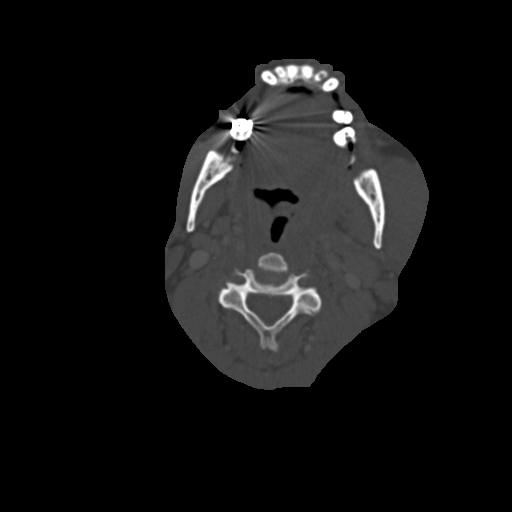
[im 103/124  soft-tissue]
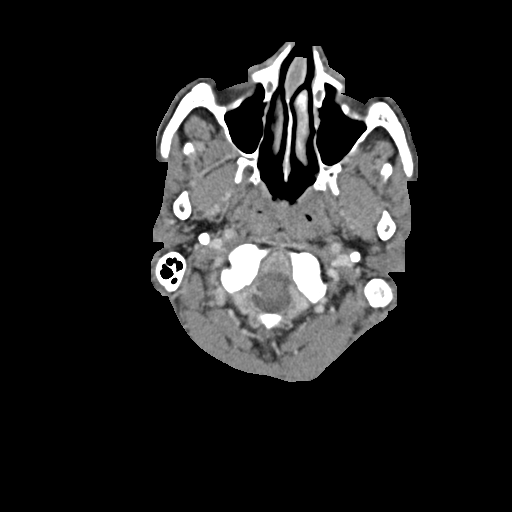
[im 103/124  bone]
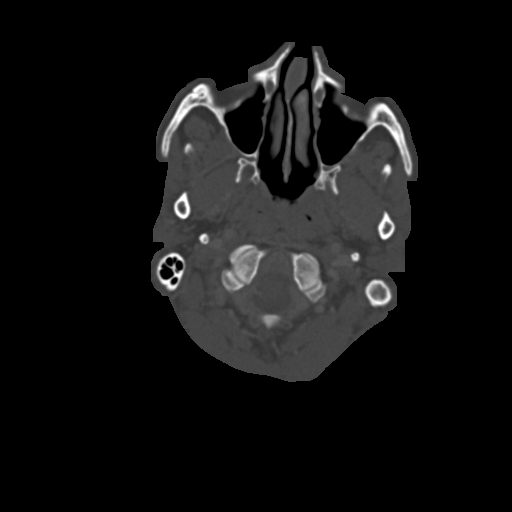

[Series 4: neck 2.0 soft tissue sag · sagittal · 0.46mm/px · 5 of 100 slices shown, 6 images]
[im 34/100  bone]
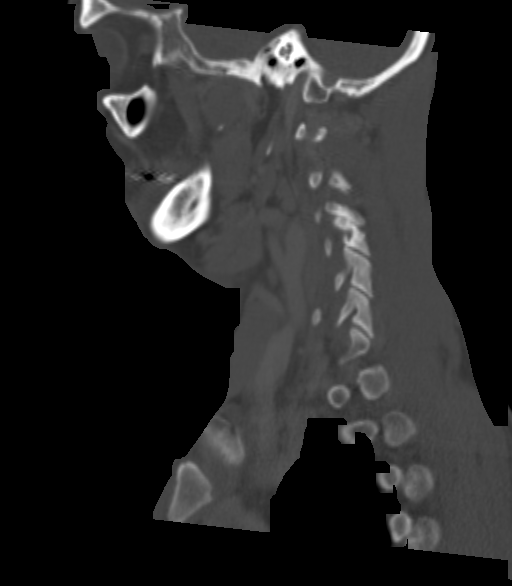
[im 42/100  bone]
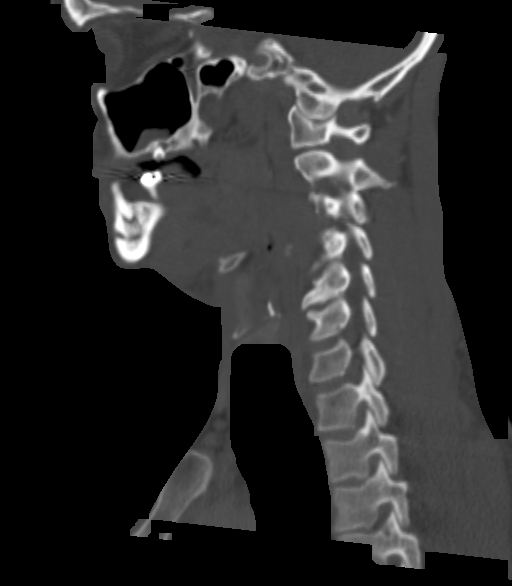
[im 50/100  soft-tissue]
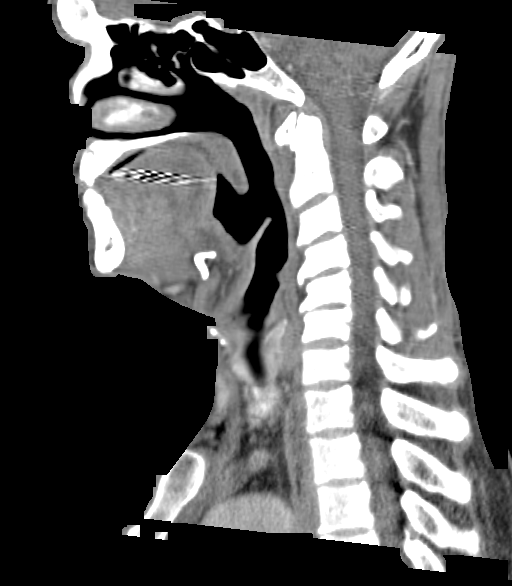
[im 50/100  bone]
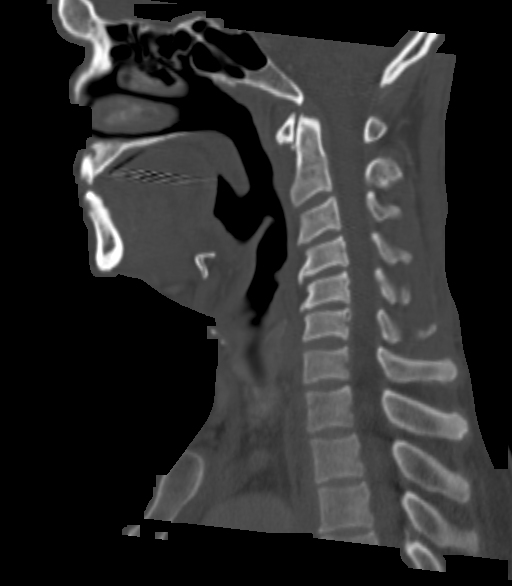
[im 58/100  bone]
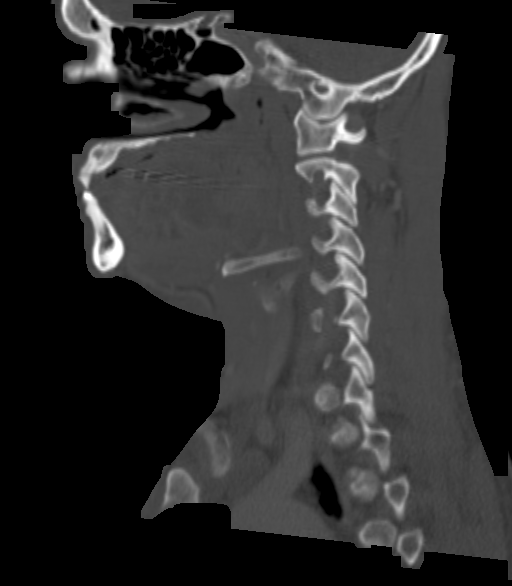
[im 67/100  bone]
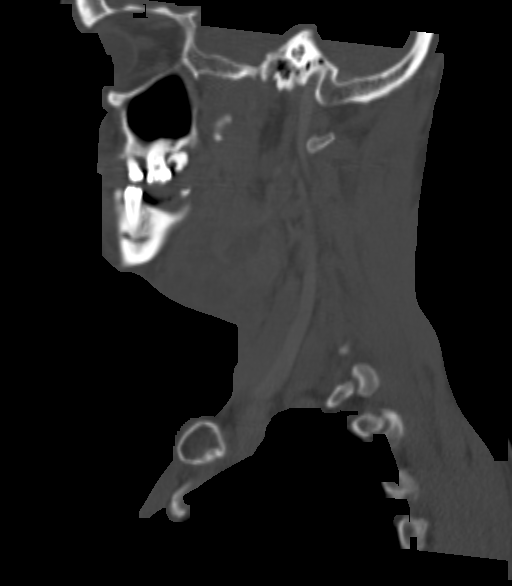

[Series 5: neck 2.0 soft tissue coro · coronal · 0.48mm/px · 3 of 135 slices shown]
[im 27/135  bone]
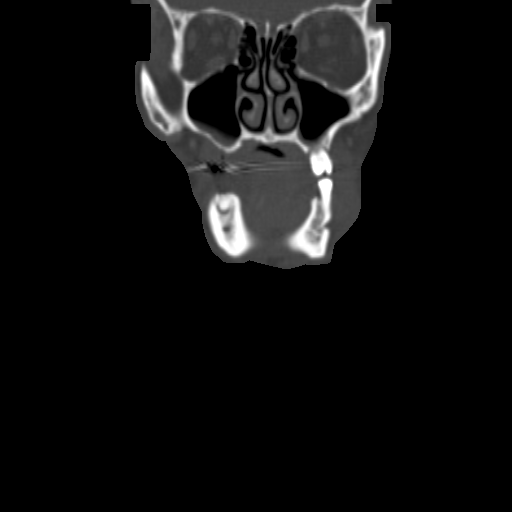
[im 54/135  bone]
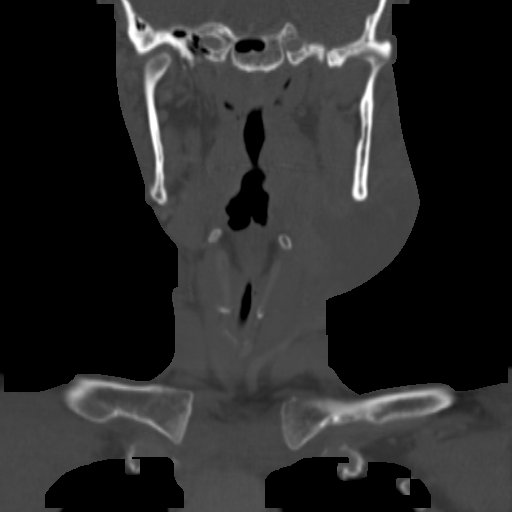
[im 81/135  bone]
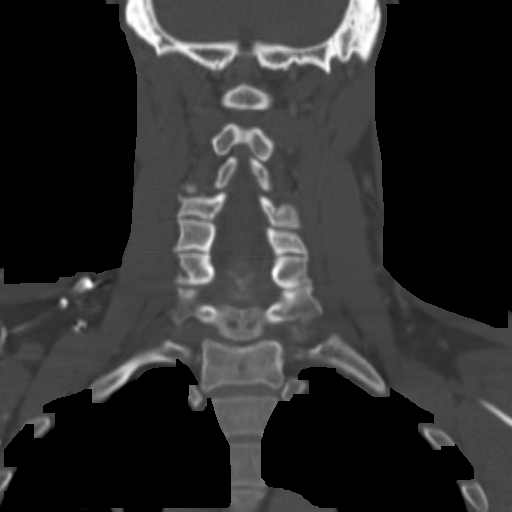

[13 of 33 positions shown; findings below may reference images not displayed]

FINDINGS: The large heterogeneous mass is centered in the left submandibular
space. Is contiguous with the lateral margin of the left
submandibular gland, which is deviated medially. This appears to
have arisen from the left submandibular gland. It is predominantly
of low attenuation with irregular peripheral enhancement and areas
of central reticular enhancement. The adjacent soft tissues are
inflamed. The mass extends superiorly to abut the Alsjon Babameto term
muscle and the inferior aspect of the superficial left parotid
gland. There is only mild associated adenopathy. Najy Tiger
jugular level 2 node is 11 mm in short axis.

The right submandibular gland is unremarkable. The soft tissue edema
extends across the submental space to the right submandibular space.
Mass effect mildly deviates the or all pharynx and laryngeal
structures to the right. The parapharyngeal spaces are relatively
maintained. The nasopharynx is unremarkable. The mass measures
cm x 5.7 cm x 7.3 cm. It abuts the inferior aspect of the mandible
mostly along its angle. No bone resorption is seen to suggest
osteomyelitis.

The structures of the skullbase are unremarkable. Normal globes and
orbits. The airway is widely patent. Edema extends to involve the
submucosal space of the oral pharynx. No mucosal space mass is seen.
The thyroid gland is unremarkable.

The lung apices are clear. The aortic arch is mildly dilated to a
maximum of 3.6 cm.

There are mild degenerative changes along the mid cervical spine. No
osteoblastic or osteolytic lesions.
IMPRESSION: 1. There is a large mass centered on the left submandibular space.
This may have arisen from the lateral margin of the left
submandibular gland. Given the history, a submandibular gland
abscess is suspected. However, based on imaging alone, this could be
a large necrotic mass, which would include neoplastic disease.
2. There is only mild associated left neck adenopathy.
3. There is surrounding soft tissue edema with edema extending to
the sub mucosal space of the oral pharynx. Mass effect causes mild
deviation of the oral pharynx and larynx to the right. No airway
compromise.

## 2019-11-30 ENCOUNTER — Emergency Department (HOSPITAL_COMMUNITY)
Admission: EM | Admit: 2019-11-30 | Discharge: 2019-11-30 | Disposition: A | Discharge status: Home / Self Care | Payer: Self-pay | Attending: Emergency Medicine | Admitting: Emergency Medicine

## 2019-11-30 ENCOUNTER — Other Ambulatory Visit: Payer: Self-pay

## 2019-11-30 ENCOUNTER — Encounter (HOSPITAL_COMMUNITY): Payer: Self-pay | Admitting: *Deleted

## 2019-11-30 DIAGNOSIS — F17210 Nicotine dependence, cigarettes, uncomplicated: Secondary | ICD-10-CM | POA: Insufficient documentation

## 2019-11-30 DIAGNOSIS — R202 Paresthesia of skin: Secondary | ICD-10-CM | POA: Insufficient documentation

## 2019-11-30 LAB — CBC
HCT: 33.7 % — ABNORMAL LOW (ref 39.0–52.0)
Hemoglobin: 10.9 g/dL — ABNORMAL LOW (ref 13.0–17.0)
MCH: 30.6 pg (ref 26.0–34.0)
MCHC: 32.3 g/dL (ref 30.0–36.0)
MCV: 94.7 fL (ref 80.0–100.0)
Platelets: 122 10*3/uL — ABNORMAL LOW (ref 150–400)
RBC: 3.56 MIL/uL — ABNORMAL LOW (ref 4.22–5.81)
RDW: 21.4 % — ABNORMAL HIGH (ref 11.5–15.5)
WBC: 3.5 10*3/uL — ABNORMAL LOW (ref 4.0–10.5)
nRBC: 1.4 % — ABNORMAL HIGH (ref 0.0–0.2)

## 2019-11-30 LAB — BASIC METABOLIC PANEL
Anion gap: 11 (ref 5–15)
BUN: 7 mg/dL (ref 6–20)
CO2: 25 mmol/L (ref 22–32)
Calcium: 9.2 mg/dL (ref 8.9–10.3)
Chloride: 101 mmol/L (ref 98–111)
Creatinine, Ser: 0.65 mg/dL (ref 0.61–1.24)
GFR calc Af Amer: >60 mL/min (ref >60)
GFR calc non Af Amer: >60 mL/min (ref >60)
Glucose, Bld: 106 mg/dL — ABNORMAL HIGH (ref 70–99)
Potassium: 3.2 mmol/L — ABNORMAL LOW (ref 3.5–5.1)
Sodium: 137 mmol/L (ref 135–145)

## 2019-11-30 LAB — MAGNESIUM: Magnesium: 1.7 mg/dL (ref 1.7–2.4)

## 2019-11-30 LAB — TSH: TSH: 2.488 u[IU]/mL (ref 0.350–4.500)

## 2019-11-30 MED ORDER — POTASSIUM CHLORIDE CRYS ER 20 MEQ PO TBCR: 20.0000 meq | EXTENDED_RELEASE_TABLET | Freq: Every day | ORAL | 0 refills | Status: AC

## 2019-11-30 MED ORDER — POTASSIUM CHLORIDE CRYS ER 20 MEQ PO TBCR
40.0000 meq | EXTENDED_RELEASE_TABLET | Freq: Once | ORAL | Status: AC
  Administered 2019-11-30: 23:00:00 40 meq via ORAL
  Filled 2019-11-30: qty 2

## 2019-11-30 NOTE — ED Provider Notes (Signed)
[facility] EMERGENCY DEPARTMENT Provider Note   CSN: [id number] Arrival date & time: 11/30/19  1823     History Chief Complaint  Patient presents with  . Numbness    left and right foot    Richard Mcfarland. is a 59 y.o. male with a history of tobacco abuse who presents to the ED with complaints of intermittent paresthesias to the bilateral feet for the past 2 to 3 weeks.  Patient states that he gets a tingling/numbness sensation in variable locations in his feet bilaterally.  This seems to come and go, it is not persistent, seems to be more frequently when he is up on his feet throughout the day.  No other alleviating or aggravating factors.  He also mentions that sometimes if he has been working in the hot sun and he bends forward and stands up he does get lightheaded with some palpitations.  No lightheadedness or palpitations at present.  He denies fever, chills, chest pain, dyspnea, syncope, weakness, or other areas of numbness/paresthesias.  He does not see a doctor regularly, he states he would like a general checkup.  He smokes cigarettes and drinks alcohol.  He denies any drug use. He denies concern for STI. His mother is at bedside who confirms above history.  HPI     History reviewed. No pertinent past medical history.  Patient Active Problem List   Diagnosis Date Noted  . Neck abscess 05/11/2013    Past Surgical History:  Procedure Laterality Date  . INCISION AND DRAINAGE ABSCESS Left 05/11/2013   Procedure: INCISION AND DRAINAGE ABSCESS;  Surgeon: Serena Colonel, MD;  Location: [facility] OR;  Service: ENT;  Laterality: Left;       Family History  Problem Relation Age of Onset  . Diabetes Other     Social History   Tobacco Use  . Smoking status: Current Every Day Smoker    Packs/day: 0.05    Years: 30.00    Pack years: 1.50    Types: Cigarettes  . Smokeless tobacco: Never Used  Substance Use Topics  . Alcohol use: Yes    Comment: heavily  . Drug use: No     Home Medications Prior to Admission medications   Not on File    Allergies    Patient has no known allergies.  Review of Systems   Review of Systems  Constitutional: Negative for chills and fever.  Respiratory: Negative for cough and shortness of breath.   Cardiovascular: Positive for palpitations (not at present). Negative for chest pain.  Gastrointestinal: Negative for abdominal pain, diarrhea and vomiting.  Neurological: Positive for light-headedness (not at present) and numbness (intermittent). Negative for syncope, speech difficulty and weakness.  All other systems reviewed and are negative.   Physical Exam Updated Vital Signs BP (!) 155/117 (BP Location: Right Arm) Comment: attempted BP x2 with same results  Pulse (!) 117   Temp 99.4 F (37.4 C) (Oral)   Resp 18   Ht 6' (1.829 m)   Wt 77.1 kg   SpO2 100%   BMI 23.06 kg/m   Physical Exam Vitals and nursing note reviewed.  Constitutional:      General: He is not in acute distress.    Appearance: Normal appearance. He is not toxic-appearing.  HENT:     Head: Normocephalic and atraumatic.     Mouth/Throat:     Pharynx: Oropharynx is clear. Uvula midline.  Eyes:     General: Vision grossly intact. Gaze aligned appropriately.  Extraocular Movements: Extraocular movements intact.     Conjunctiva/sclera: Conjunctivae normal.     Pupils: Pupils are equal, round, and reactive to light.     Comments: No proptosis.   Cardiovascular:     Rate and Rhythm: Normal rate and regular rhythm.     Heart sounds: No murmur heard.   Pulmonary:     Effort: Pulmonary effort is normal.     Breath sounds: Normal breath sounds.  Abdominal:     General: There is no distension.     Palpations: Abdomen is soft.     Tenderness: There is no abdominal tenderness. There is no guarding or rebound.  Musculoskeletal:     Cervical back: Normal range of motion and neck supple. No rigidity.  Skin:    General: Skin is warm and dry.   Neurological:     Mental Status: He is alert.     Comments: Alert. Clear speech. No facial droop. CNIII-XII grossly intact. Bilateral upper and lower extremities' sensation grossly intact-also intact to sharp/dull touch discrimination. 5/5 symmetric strength with grip strength and with plantar and dorsi flexion bilaterally . Normal finger to nose bilaterally. Negative pronator drift. Gait intact.    Psychiatric:        Mood and Affect: Mood normal.        Behavior: Behavior normal.     ED Results / Procedures / Treatments   Labs (all labs ordered are listed, but only abnormal results are displayed) Labs Reviewed  CBC - Abnormal; Notable for the following components:      Result Value   WBC 3.5 (*)    RBC 3.56 (*)    Hemoglobin 10.9 (*)    HCT 33.7 (*)    RDW 21.4 (*)    Platelets 122 (*)    nRBC 1.4 (*)    All other components within normal limits  BASIC METABOLIC PANEL - Abnormal; Notable for the following components:   Potassium 3.2 (*)    Glucose, Bld 106 (*)    All other components within normal limits  MAGNESIUM  TSH    EKG EKG Interpretation  Date/Time:  Sunday November 30 2019 22:12:45 EDT Ventricular Rate:  71 PR Interval:    QRS Duration: 83 QT Interval:  436 QTC Calculation: 474 R Axis:   -3 Text Interpretation: Sinus rhythm Minimal ST depression, diffuse leads Baseline wander in lead(s) V3 No old tracing to compare Confirmed by Mancel Bale [phone]) on 11/30/2019 10:16:28 PM   Radiology No results found.  Procedures Procedures (including critical care time)  Medications Ordered in ED Medications - No data to display  ED Course  I have reviewed the triage vital signs and the nursing notes.  Pertinent labs & imaging results that were available during my care of the patient were reviewed by me and considered in my medical decision making (see chart for details).    MDM Rules/Calculators/A&P                         Patient presents to the ED with  complaints of intermittent paresthesias to the feet bilaterally. Also mentions some intermittent lightheadedness/palpitations at work. Nontoxic appearing, initial tachycardia normalized on my exam, BP mildly elevated- doubt HTN emergency.   Additional history obtained:  Additional history obtained from patient's mother @ bedside. Previous records obtained and reviewed.  EKG: No STEMI,  Lab Tests:  I Ordered, reviewed, and interpreted labs, which included:  CBC: Mild anemia-normocytic.  Mild thrombocytopenia  and leukopenia noted as well. BMP: Mild hypokalemia, will orally replaced.  No significant electrolyte derangement.  No significant hyperglycemia. Magnesium: Within normal limits. TSH: Pending  Patient without any focal neurologic deficits on exam, symptoms are to bilateral feet intermittently, does not seem consistent with acute CVA.  Paresthesias do not a standing or associated with any weakness therefore do not suspect Guillain-Barr syndrome.  Patient has mild hypokalemia (QTc 474) and anemia, will feel less likely that these to be causing symptomatic neuropathies.  Will give patient a potassium supplement.  His TSH is pending.  His EKG and cardiac monitor did not show any significant arrhythmias.  No obvious emergent cause for his intermittent paresthesias.  He overall appears appropriate for discharge home with close PCP follow-up. I discussed results, treatment plan, need for follow-up, and return precautions with the patient & his mother @ bedside. Provided opportunity for questions, patient & his mother confirmed understanding and are in agreement with plan.   Portions of this note were generated with Scientist, clinical (histocompatibility and immunogenetics). Dictation errors may occur despite best attempts at proofreading.   Final Clinical Impression(s) / ED Diagnoses Final diagnoses:  Paresthesia    Rx / DC Orders ED Discharge Orders    None       Cherly Anderson, PA-C 11/30/19 2309    Mancel Bale, MD 12/01/19 1147

## 2019-11-30 NOTE — ED Triage Notes (Signed)
Patient comes to the ED with bilateral foot numbness for two weeks.  Patient reports only parts of the feet are numb.

## 2019-11-30 NOTE — Discharge Instructions (Addendum)
You were seen in the ER today for numbness/tingling to your feet.   Your work-up in the ER showed that your potassium was a bit low, we are sending you home with a supplement.  You also have some mild the low blood counts including some anemia, mildly low platelets, mildly low white blood cell count.  We would like you to have this followed by primary care provider for recheck of your labs in 1 week.  Return to the ER for any new or worsening symptoms including but not limited to worsening numbness, numbness in new locations, weakness, trouble walking, chest pain, abnormal heart sensation, inability to keep fluids down, fever, or any other concerns.  Below is a list of local primary care providers.  There are a few list on [employer] which we discussed.  [facility] Primary Care Doctor List  Kari Baars MD. Specialty: Pulmonary Disease Contact information: [address]  [address]  [place] [address]  [phone]   Syliva Overman, MD. Specialty: [facility] Contact information: [address]  [place] [address]  [phone]   Lilyan Punt, MD. Specialty: [facility] Medicine Contact information: [address]  [place] [address]  [phone]   Avon Gully, MD Specialty: Internal Medicine Contact information: [address] [place] [address]  [phone]   Catalina Pizza, MD. Specialty: Internal Medicine Contact information: [address]  [place] [address]  [phone]    [facility] (Dr. Selena Batten) Specialty: Family Medicine Contact information: [address]  [place] [address]  [phone]   John Giovanni, MD. Specialty: [facility] Medicine Contact information: [address]  [address]  [place] [address]  [phone]   Carylon Perches, MD. Specialty: Internal Medicine Contact information: [address]  [address]  [place] [address]  [phone]    [facility]  [address] [place], [place] [address] [phone]  Services [facility] offers a variety of basic health services.  Services include but are not limited to: Blood pressure checks  Heart rate checks  Blood sugar checks  Urine analysis  Rapid strep tests  Pregnancy tests.  Health education and referrals  People needing more complex services will be directed to a physician online. Using these virtual visits, doctors can evaluate and prescribe medicine and treatments. There will be no medication on-site, though [facility] will help patients fill their prescriptions at little to no cost.   For More information please go to: [email or web address]
